# Patient Record
Sex: Female | Born: 1975 | Race: Black or African American | Hispanic: No | Marital: Single | Smoking: Never smoker
Health system: Southern US, Community
[De-identification: ages and names within clinical notes are randomized; demographics above are authoritative.]

## PROBLEM LIST (undated history)

## (undated) DIAGNOSIS — D649 Anemia, unspecified: Secondary | ICD-10-CM

## (undated) DIAGNOSIS — D219 Benign neoplasm of connective and other soft tissue, unspecified: Secondary | ICD-10-CM

## (undated) DIAGNOSIS — K2970 Gastritis, unspecified, without bleeding: Secondary | ICD-10-CM

## (undated) HISTORY — DX: Gastritis, unspecified, without bleeding: K29.70

---

## 1998-11-22 ENCOUNTER — Emergency Department (HOSPITAL_COMMUNITY): Admission: EM | Admit: 1998-11-22 | Discharge: 1998-11-22 | Payer: Self-pay | Admitting: Emergency Medicine

## 1998-12-27 ENCOUNTER — Emergency Department (HOSPITAL_COMMUNITY): Admission: EM | Admit: 1998-12-27 | Discharge: 1998-12-27 | Payer: Self-pay | Admitting: Emergency Medicine

## 1998-12-30 ENCOUNTER — Inpatient Hospital Stay (HOSPITAL_COMMUNITY): Admission: AD | Admit: 1998-12-30 | Discharge: 1998-12-30 | Payer: Self-pay | Admitting: Obstetrics

## 1999-01-16 ENCOUNTER — Other Ambulatory Visit: Admission: RE | Admit: 1999-01-16 | Discharge: 1999-01-16 | Payer: Self-pay | Admitting: Obstetrics

## 1999-01-16 ENCOUNTER — Inpatient Hospital Stay (HOSPITAL_COMMUNITY): Admission: AD | Admit: 1999-01-16 | Discharge: 1999-01-16 | Payer: Self-pay | Admitting: Obstetrics & Gynecology

## 1999-08-07 ENCOUNTER — Inpatient Hospital Stay (HOSPITAL_COMMUNITY): Admission: AD | Admit: 1999-08-07 | Discharge: 1999-08-07 | Payer: Self-pay | Admitting: Obstetrics

## 1999-08-16 ENCOUNTER — Inpatient Hospital Stay (HOSPITAL_COMMUNITY): Admission: AD | Admit: 1999-08-16 | Discharge: 1999-08-16 | Payer: Self-pay | Admitting: Obstetrics

## 1999-08-18 ENCOUNTER — Encounter (HOSPITAL_COMMUNITY): Admission: EM | Admit: 1999-08-18 | Discharge: 1999-08-23 | Payer: Self-pay

## 1999-08-22 ENCOUNTER — Inpatient Hospital Stay (HOSPITAL_COMMUNITY): Admission: AD | Admit: 1999-08-22 | Discharge: 1999-08-24 | Payer: Self-pay | Admitting: *Deleted

## 1999-08-27 ENCOUNTER — Inpatient Hospital Stay (HOSPITAL_COMMUNITY): Admission: AD | Admit: 1999-08-27 | Discharge: 1999-08-27 | Payer: Self-pay | Admitting: Obstetrics

## 2000-07-08 ENCOUNTER — Inpatient Hospital Stay (HOSPITAL_COMMUNITY): Admission: AD | Admit: 2000-07-08 | Discharge: 2000-07-08 | Payer: Self-pay | Admitting: *Deleted

## 2000-07-08 ENCOUNTER — Encounter: Payer: Self-pay | Admitting: *Deleted

## 2000-07-15 ENCOUNTER — Inpatient Hospital Stay (HOSPITAL_COMMUNITY): Admission: AD | Admit: 2000-07-15 | Discharge: 2000-07-15 | Payer: Self-pay | Admitting: Obstetrics & Gynecology

## 2000-08-13 ENCOUNTER — Emergency Department (HOSPITAL_COMMUNITY): Admission: EM | Admit: 2000-08-13 | Discharge: 2000-08-13 | Payer: Self-pay | Admitting: Emergency Medicine

## 2001-10-24 ENCOUNTER — Emergency Department (HOSPITAL_COMMUNITY): Admission: EM | Admit: 2001-10-24 | Discharge: 2001-10-24 | Payer: Self-pay | Admitting: Emergency Medicine

## 2002-04-28 ENCOUNTER — Inpatient Hospital Stay (HOSPITAL_COMMUNITY): Admission: AD | Admit: 2002-04-28 | Discharge: 2002-04-28 | Payer: Self-pay | Admitting: *Deleted

## 2013-01-23 ENCOUNTER — Encounter: Payer: Self-pay | Admitting: Obstetrics

## 2013-02-11 ENCOUNTER — Encounter: Payer: Self-pay | Admitting: Obstetrics

## 2013-02-11 ENCOUNTER — Ambulatory Visit (INDEPENDENT_AMBULATORY_CARE_PROVIDER_SITE_OTHER): Payer: Medicaid Other | Admitting: Obstetrics

## 2013-02-11 VITALS — BP 102/82 | HR 81 | Temp 99.1°F | Ht 68.0 in | Wt 161.0 lb

## 2013-02-11 DIAGNOSIS — D259 Leiomyoma of uterus, unspecified: Secondary | ICD-10-CM | POA: Insufficient documentation

## 2013-02-11 NOTE — Progress Notes (Signed)
Subjective:     Janet Park is a 37 y.o. female here for a routine exam.  Current complaints: patient is in the office for heavy and constant bleeding with her fibroids. Patient has an IUD ( Mirena- 2 years) and fibroids that are 16 weeks..  Personal health questionnaire reviewed: no.   Gynecologic History No LMP recorded. Contraception: abstinence Last Pap: over 1 year. Results were: normal Last mammogram: never.   Obstetric History OB History  No data available     The following portions of the patient's history were reviewed and updated as appropriate: allergies, current medications, past family history, past medical history, past social history, past surgical history and problem list.  Review of Systems Pertinent items are noted in HPI.    Objective:    General appearance: alert and no distress Abdomen: normal findings: soft, non-tender Pelvic: cervix normal in appearance, external genitalia normal, no adnexal masses or tenderness, no cervical motion tenderness, vagina normal without discharge and uterus ~ 16 weeks size, NT.    Assessment:    Uterine fibroids, symptomatic.  Options discussed.   Plan:    Education reviewed: Management of uterine fibroids.Marland Kitchen

## 2014-07-07 ENCOUNTER — Other Ambulatory Visit (HOSPITAL_COMMUNITY): Payer: Self-pay | Admitting: Obstetrics

## 2014-07-07 DIAGNOSIS — R1900 Intra-abdominal and pelvic swelling, mass and lump, unspecified site: Secondary | ICD-10-CM

## 2014-07-07 DIAGNOSIS — Z1231 Encounter for screening mammogram for malignant neoplasm of breast: Secondary | ICD-10-CM

## 2014-07-07 DIAGNOSIS — D219 Benign neoplasm of connective and other soft tissue, unspecified: Secondary | ICD-10-CM

## 2014-07-14 ENCOUNTER — Ambulatory Visit (HOSPITAL_COMMUNITY)
Admission: RE | Admit: 2014-07-14 | Discharge: 2014-07-14 | Disposition: A | Discharge status: Home / Self Care | Payer: Medicaid Other | Source: Ambulatory Visit | Attending: Obstetrics | Admitting: Obstetrics

## 2014-07-14 DIAGNOSIS — R1900 Intra-abdominal and pelvic swelling, mass and lump, unspecified site: Secondary | ICD-10-CM | POA: Diagnosis not present

## 2014-07-14 DIAGNOSIS — D259 Leiomyoma of uterus, unspecified: Secondary | ICD-10-CM | POA: Insufficient documentation

## 2014-07-14 DIAGNOSIS — D219 Benign neoplasm of connective and other soft tissue, unspecified: Secondary | ICD-10-CM

## 2014-07-14 DIAGNOSIS — Z1231 Encounter for screening mammogram for malignant neoplasm of breast: Secondary | ICD-10-CM | POA: Diagnosis not present

## 2014-07-14 DIAGNOSIS — N939 Abnormal uterine and vaginal bleeding, unspecified: Secondary | ICD-10-CM | POA: Diagnosis not present

## 2014-07-14 DIAGNOSIS — Z975 Presence of (intrauterine) contraceptive device: Secondary | ICD-10-CM | POA: Diagnosis not present

## 2014-07-20 ENCOUNTER — Other Ambulatory Visit: Payer: Self-pay | Admitting: Obstetrics

## 2014-08-18 ENCOUNTER — Other Ambulatory Visit: Payer: Self-pay | Admitting: Obstetrics

## 2014-08-23 NOTE — H&P (Signed)
Janet Park, Janet Park NO.:  [id number]  MEDICAL RECORD NO.:  [id number]  LOCATION:                                 FACILITY:  PHYSICIAN:  Frederico Hamman, M.D.DATE OF BIRTH:  [birth year]  DATE OF ADMISSION: DATE OF DISCHARGE:                             HISTORY & PHYSICAL   The patient is a 39 year old, gravida 1, para 55, child 82 years old and she has an IUD and she has large myomas and abnormal bleeding and scheduled for a hysterectomy in April.  The patient's Pap smear was abnormal.  Colposcopy showed severe dysplasia.  GYN-Oncology consult was obtained and this suggested a cervical conization, so patient is to have a conization on Wednesday 9th.  PAST MEDICAL HISTORY:  Negative.  PAST SURGICAL HISTORY:  Negative.  SOCIAL HISTORY:  Negative.  SYSTEM REVIEW:  Negative.  PHYSICAL EXAMINATION:  GENERAL:  Well-developed female in no distress. HEENT:  Negative. LUNGS:  Clear to P and A. HEART:  Regular rhythm.  No murmurs.  No gallops. BREASTS:  Negative. ABDOMEN:  Mass arising from the pelvis, 22-week size, myomas. EXTREMITIES:  Negative.          ______________________________ Frederico Hamman, M.D.     BAM/MEDQ  D:  08/21/2014  T:  08/21/2014  Job:  [id number]

## 2014-08-23 NOTE — Patient Instructions (Addendum)
   Your procedure is scheduled on:  Wednesday, March 9   Enter through the [employer] [facility] at: 7 AM Pick up the phone at the desk and dial [phone] and inform us of your arrival.  Please call this number if you have any problems the morning of surgery: [phone]  Remember: Do not eat or drink after midnight: Tuesday Take these medicines the morning of surgery with a SIP OF WATER:  None  Do not wear jewelry, make-up, or FINGER nail polish No metal in your hair or on your body. Do not wear lotions, powders, perfumes.  You may wear deodorant.  Do not bring valuables to the hospital. Contacts, dentures or bridgework may not be worn into surgery.  Patients discharged on the day of surgery will not be allowed to drive home.  Home with niece Janet Park cell [phone]

## 2014-08-24 ENCOUNTER — Encounter (INDEPENDENT_AMBULATORY_CARE_PROVIDER_SITE_OTHER): Payer: Self-pay

## 2014-08-24 ENCOUNTER — Encounter (HOSPITAL_COMMUNITY)
Admission: RE | Admit: 2014-08-24 | Discharge: 2014-08-24 | Disposition: A | Discharge status: Home / Self Care | Payer: Medicaid Other | Source: Ambulatory Visit | Attending: Obstetrics | Admitting: Obstetrics

## 2014-08-24 ENCOUNTER — Encounter (HOSPITAL_COMMUNITY): Payer: Self-pay

## 2014-08-24 DIAGNOSIS — Z975 Presence of (intrauterine) contraceptive device: Secondary | ICD-10-CM | POA: Diagnosis not present

## 2014-08-24 DIAGNOSIS — D259 Leiomyoma of uterus, unspecified: Secondary | ICD-10-CM | POA: Diagnosis not present

## 2014-08-24 DIAGNOSIS — D069 Carcinoma in situ of cervix, unspecified: Secondary | ICD-10-CM | POA: Diagnosis not present

## 2014-08-24 HISTORY — DX: Benign neoplasm of connective and other soft tissue, unspecified: D21.9

## 2014-08-24 HISTORY — DX: Anemia, unspecified: D64.9

## 2014-08-24 LAB — CBC
HCT: 36.1 % (ref 36.0–46.0)
HEMOGLOBIN: 11 g/dL — AB (ref 12.0–15.0)
MCH: 24.3 pg — AB (ref 26.0–34.0)
MCHC: 30.5 g/dL (ref 30.0–36.0)
MCV: 79.9 fL (ref 78.0–100.0)
Platelets: 356 10*3/uL (ref 150–400)
RBC: 4.52 MIL/uL (ref 3.87–5.11)
RDW: 12.9 % (ref 11.5–15.5)
WBC: 5.5 10*3/uL (ref 4.0–10.5)

## 2014-08-24 NOTE — Anesthesia Preprocedure Evaluation (Signed)
Anesthesia Evaluation  Patient identified by MRN, date of birth, ID band Patient awake    Reviewed: Allergy & Precautions, NPO status , Patient's Chart, lab work & pertinent test results  History of Anesthesia Complications Negative for: history of anesthetic complications  Airway Mallampati: II  TM Distance: >3 FB Neck ROM: Full    Dental no notable dental hx. (+) Dental Advisory Given   Pulmonary neg pulmonary ROS,  breath sounds clear to auscultation  Pulmonary exam normal       Cardiovascular negative cardio ROS  Rhythm:Regular Rate:Normal     Neuro/Psych negative neurological ROS  negative psych ROS   GI/Hepatic negative GI ROS, Neg liver ROS,   Endo/Other  negative endocrine ROS  Renal/GU negative Renal ROS  Female GU complaint     Musculoskeletal negative musculoskeletal ROS (+)   Abdominal   Peds negative pediatric ROS (+)  Hematology  (+) anemia ,   Anesthesia Other Findings   Reproductive/Obstetrics negative OB ROS                             Anesthesia Physical Anesthesia Plan  ASA: II  Anesthesia Plan: General   Post-op Pain Management:    Induction: Intravenous  Airway Management Planned: LMA  Additional Equipment:   Intra-op Plan:   Post-operative Plan: Extubation in OR  Informed Consent: I have reviewed the patients History and Physical, chart, labs and discussed the procedure including the risks, benefits and alternatives for the proposed anesthesia with the patient or authorized representative who has indicated his/her understanding and acceptance.   Dental advisory given  Plan Discussed with: CRNA  Anesthesia Plan Comments:         Anesthesia Quick Evaluation

## 2014-08-25 ENCOUNTER — Encounter (HOSPITAL_COMMUNITY): Payer: Self-pay | Admitting: Anesthesiology

## 2014-08-25 ENCOUNTER — Encounter (HOSPITAL_COMMUNITY)
Admission: RE | Disposition: A | Discharge status: Home / Self Care | Payer: Self-pay | Source: Ambulatory Visit | Attending: Obstetrics

## 2014-08-25 ENCOUNTER — Ambulatory Visit (HOSPITAL_COMMUNITY)
Admission: RE | Admit: 2014-08-25 | Discharge: 2014-08-25 | Disposition: A | Discharge status: Home / Self Care | Payer: Medicaid Other | Source: Ambulatory Visit | Attending: Obstetrics | Admitting: Obstetrics

## 2014-08-25 ENCOUNTER — Ambulatory Visit (HOSPITAL_COMMUNITY): Payer: Medicaid Other | Admitting: Anesthesiology

## 2014-08-25 DIAGNOSIS — D259 Leiomyoma of uterus, unspecified: Secondary | ICD-10-CM | POA: Diagnosis not present

## 2014-08-25 DIAGNOSIS — D069 Carcinoma in situ of cervix, unspecified: Secondary | ICD-10-CM | POA: Diagnosis not present

## 2014-08-25 DIAGNOSIS — Z975 Presence of (intrauterine) contraceptive device: Secondary | ICD-10-CM | POA: Diagnosis not present

## 2014-08-25 HISTORY — PX: CERVICAL CONIZATION W/BX: SHX1330

## 2014-08-25 LAB — PREGNANCY, URINE: Preg Test, Ur: NEGATIVE

## 2014-08-25 SURGERY — CONE BIOPSY, CERVIX
Anesthesia: General | Site: Vagina

## 2014-08-25 MED ORDER — FENTANYL CITRATE 0.05 MG/ML IJ SOLN
INTRAMUSCULAR | Status: AC
  Filled 2014-08-25: qty 5

## 2014-08-25 MED ORDER — IODINE STRONG (LUGOLS) 5 % PO SOLN
ORAL | Status: AC
  Filled 2014-08-25: qty 1

## 2014-08-25 MED ORDER — LACTATED RINGERS IV SOLN
INTRAVENOUS | Status: DC
  Administered 2014-08-25 (×2): via INTRAVENOUS

## 2014-08-25 MED ORDER — DEXAMETHASONE SODIUM PHOSPHATE 10 MG/ML IJ SOLN
INTRAMUSCULAR | Status: AC
  Filled 2014-08-25: qty 1

## 2014-08-25 MED ORDER — PROPOFOL 10 MG/ML IV BOLUS
INTRAVENOUS | Status: AC
  Filled 2014-08-25: qty 20

## 2014-08-25 MED ORDER — GLYCOPYRROLATE 0.2 MG/ML IJ SOLN
INTRAMUSCULAR | Status: DC | PRN
  Administered 2014-08-25: 0.1 mg via INTRAVENOUS

## 2014-08-25 MED ORDER — ONDANSETRON HCL 4 MG/2ML IJ SOLN
INTRAMUSCULAR | Status: DC | PRN
  Administered 2014-08-25: 4 mg via INTRAVENOUS

## 2014-08-25 MED ORDER — ONDANSETRON HCL 4 MG/2ML IJ SOLN: 4.0000 mg | Freq: Once | INTRAMUSCULAR | Status: DC | PRN

## 2014-08-25 MED ORDER — MIDAZOLAM HCL 2 MG/2ML IJ SOLN
INTRAMUSCULAR | Status: AC
  Filled 2014-08-25: qty 2

## 2014-08-25 MED ORDER — LIDOCAINE HCL 1 % IJ SOLN
INTRAMUSCULAR | Status: AC
  Filled 2014-08-25: qty 20

## 2014-08-25 MED ORDER — GLYCOPYRROLATE 0.2 MG/ML IJ SOLN
INTRAMUSCULAR | Status: AC
  Filled 2014-08-25: qty 1

## 2014-08-25 MED ORDER — LIDOCAINE HCL (CARDIAC) 20 MG/ML IV SOLN
INTRAVENOUS | Status: AC
  Filled 2014-08-25: qty 5

## 2014-08-25 MED ORDER — SCOPOLAMINE 1 MG/3DAYS TD PT72
1.0000 | MEDICATED_PATCH | Freq: Once | TRANSDERMAL | Status: DC
  Administered 2014-08-25: 1.5 mg via TRANSDERMAL

## 2014-08-25 MED ORDER — FENTANYL CITRATE 0.05 MG/ML IJ SOLN
INTRAMUSCULAR | Status: DC | PRN
  Administered 2014-08-25 (×2): 50 ug via INTRAVENOUS

## 2014-08-25 MED ORDER — FERRIC SUBSULFATE 259 MG/GM EX SOLN
CUTANEOUS | Status: AC
  Filled 2014-08-25: qty 8

## 2014-08-25 MED ORDER — ACETIC ACID 5 % SOLN
Status: AC
Start: 2014-08-25 — End: 2014-08-25
  Filled 2014-08-25: qty 500

## 2014-08-25 MED ORDER — PROPOFOL 10 MG/ML IV BOLUS
INTRAVENOUS | Status: DC | PRN
  Administered 2014-08-25: 200 mg via INTRAVENOUS

## 2014-08-25 MED ORDER — DEXAMETHASONE SODIUM PHOSPHATE 10 MG/ML IJ SOLN
INTRAMUSCULAR | Status: DC | PRN
  Administered 2014-08-25: 4 mg via INTRAVENOUS

## 2014-08-25 MED ORDER — FENTANYL CITRATE 0.05 MG/ML IJ SOLN: 25.0000 ug | INTRAMUSCULAR | Status: DC | PRN

## 2014-08-25 MED ORDER — ONDANSETRON HCL 4 MG/2ML IJ SOLN
INTRAMUSCULAR | Status: AC
  Filled 2014-08-25: qty 2

## 2014-08-25 MED ORDER — LIDOCAINE HCL (CARDIAC) 20 MG/ML IV SOLN
INTRAVENOUS | Status: DC | PRN
  Administered 2014-08-25: 80 mg via INTRAVENOUS

## 2014-08-25 MED ORDER — SCOPOLAMINE 1 MG/3DAYS TD PT72
MEDICATED_PATCH | TRANSDERMAL | Status: AC
  Filled 2014-08-25: qty 1

## 2014-08-25 MED ORDER — KETOROLAC TROMETHAMINE 30 MG/ML IJ SOLN
INTRAMUSCULAR | Status: AC
  Filled 2014-08-25: qty 1

## 2014-08-25 MED ORDER — MIDAZOLAM HCL 2 MG/2ML IJ SOLN
INTRAMUSCULAR | Status: DC | PRN
  Administered 2014-08-25: 2 mg via INTRAVENOUS

## 2014-08-25 SURGICAL SUPPLY — 21 items
APPLICATOR COTTON TIP 6IN STRL (MISCELLANEOUS) IMPLANT
BLADE SURG 11 STRL SS (BLADE) ×3 IMPLANT
CLOTH BEACON ORANGE TIMEOUT ST (SAFETY) ×3 IMPLANT
CONTAINER PREFILL 10% NBF 60ML (FORM) ×3 IMPLANT
COUNTER NEEDLE 1200 MAGNETIC (NEEDLE) IMPLANT
ELECT REM PT RETURN 9FT ADLT (ELECTROSURGICAL)
ELECTRODE REM PT RTRN 9FT ADLT (ELECTROSURGICAL) IMPLANT
GAUZE PACKING 2X5 YD STRL (GAUZE/BANDAGES/DRESSINGS) ×3 IMPLANT
GLOVE BIO SURGEON STRL SZ8.5 (GLOVE) ×3 IMPLANT
GOWN STRL REUS W/TWL 2XL LVL3 (GOWN DISPOSABLE) ×3 IMPLANT
GOWN STRL REUS W/TWL LRG LVL3 (GOWN DISPOSABLE) ×3 IMPLANT
NS IRRIG 1000ML POUR BTL (IV SOLUTION) ×3 IMPLANT
PACK VAGINAL MINOR WOMEN LF (CUSTOM PROCEDURE TRAY) ×3 IMPLANT
PAD OB MATERNITY 4.3X12.25 (PERSONAL CARE ITEMS) ×3 IMPLANT
PENCIL BUTTON HOLSTER BLD 10FT (ELECTRODE) IMPLANT
SCOPETTES 8  STERILE (MISCELLANEOUS) ×4
SCOPETTES 8 STERILE (MISCELLANEOUS) ×2 IMPLANT
SPONGE SURGIFOAM ABS GEL 12-7 (HEMOSTASIS) IMPLANT
SUT CHROMIC 1 CT1 27 (SUTURE) IMPLANT
TOWEL OR 17X24 6PK STRL BLUE (TOWEL DISPOSABLE) ×6 IMPLANT
WATER STERILE IRR 1000ML POUR (IV SOLUTION) ×3 IMPLANT

## 2014-08-25 NOTE — Op Note (Signed)
Preop diagnosis severe dysplasia of the cervix and 24 week size myomatous Postop diagnosis the sam failed attempt  at cervical conization Anesthesia Gen. Surgeon Dr. Gracy Racer Procedure  Under  general anesthesia per vagina prepped and draped bladder emptied with a straight catheter bimanual exam revealed multiple myomas 24 weeks size and posterior to the cervix is a large myoma the cervix was deviated to the left and was not visualized in the vagina a weighted speculum place and retractors placed cervix still not visualized tenaculum was used to grasp the mucosa of the vagina close to the cervix but the cervix could not be visualized and there was bleeding from the site with a tenaculum was used to grasp the vagina and it was decided the vagina would be packed and the packing removed in 24 hours no other procedure was done

## 2014-08-25 NOTE — Transfer of Care (Signed)
Immediate Anesthesia Transfer of Care Note  Patient: Janet Park  Procedure(s) Performed: Procedure(s): ATTEMPTED CONIZATION CERVIX  (N/A)  Patient Location: PACU  Anesthesia Type:General  Level of Consciousness: awake, alert , oriented and patient cooperative  Airway & Oxygen Therapy: Patient Spontanous Breathing and Patient connected to nasal cannula oxygen  Post-op Assessment: Report given to RN and Post -op Vital signs reviewed and stable  Post vital signs: Reviewed and stable  Last Vitals:  Filed Vitals:   08/25/14 0710  BP: 109/76  Pulse: 83  Temp: 37.3 C  Resp: 18    Complications: No apparent anesthesia complications

## 2014-08-25 NOTE — H&P (Signed)
  There has been no change in her h and p since the original l dictation

## 2014-08-25 NOTE — Anesthesia Postprocedure Evaluation (Signed)
  Anesthesia Post-op Note  Patient: Janet Park  Procedure(s) Performed: Procedure(s) (LRB): ATTEMPTED CONIZATION CERVIX  (N/A)  Patient Location: PACU  Anesthesia Type: General  Level of Consciousness: awake and alert   Airway and Oxygen Therapy: Patient Spontanous Breathing  Post-op Pain: mild  Post-op Assessment: Post-op Vital signs reviewed, Patient's Cardiovascular Status Stable, Respiratory Function Stable, Patent Airway and No signs of Nausea or vomiting  Last Vitals:  Filed Vitals:   08/25/14 0910  BP: 117/72  Pulse: 76  Temp: 37.1 C  Resp: 12    Post-op Vital Signs: stable   Complications: No apparent anesthesia complications

## 2014-08-25 NOTE — Discharge Instructions (Signed)
°  Post Anesthesia Home Care Instructions  Activity: Get plenty of rest for the remainder of the day. A responsible adult should stay with you for 24 hours following the procedure.  For the next 24 hours, DO NOT: -Drive a car -Paediatric nurse -Drink alcoholic beverages -Take any medication unless instructed by your physician -Make any legal decisions or sign important papers.  Meals: Start with liquid foods such as gelatin or soup. Progress to regular foods as tolerated. Avoid greasy, spicy, heavy foods. If nausea and/or vomiting occur, drink only clear liquids until the nausea and/or vomiting subsides. Call your physician if vomiting continues.  Special Instructions/Symptoms: Your throat may feel dry or sore from the anesthesia or the breathing tube placed in your throat during surgery. If this causes discomfort, gargle with warm salt water. The discomfort should disappear within 24 hours. DISCHARGE INSTRUCTIONS: D&C / D&E The following instructions have been prepared to help you care for yourself upon your return home.   Personal hygiene:  Use sanitary pads for vaginal drainage, not tampons.  Shower the day after your procedure.  NO tub baths, pools or Jacuzzis for 2-3 weeks.  Wipe front to back after using the bathroom.  Activity and limitations:  Do NOT drive or operate any equipment for 24 hours. The effects of anesthesia are still present and drowsiness may result.  Do NOT rest in bed all day.  Walking is encouraged.  Walk up and down stairs slowly.  You may resume your normal activity in one to two days or as indicated by your physician.  Sexual activity: NO intercourse for at least 2 weeks after the procedure, or as indicated by your physician.  Diet: Eat a light meal as desired this evening. You may resume your usual diet tomorrow.  Return to work: You may resume your work activities in one to two days or as indicated by your doctor.  What to expect after your  surgery: Expect to have vaginal bleeding/discharge for 2-3 days and spotting for up to 10 days. It is not unusual to have soreness for up to 1-2 weeks. You may have a slight burning sensation when you urinate for the first day. Mild cramps may continue for a couple of days. You may have a regular period in 2-6 weeks.  Call your doctor for any of the following:  Excessive vaginal bleeding, saturating and changing one pad every hour.  Inability to urinate 6 hours after discharge from hospital.  Pain not relieved by pain medication.  Fever of 100.4 F or greater.  Unusual vaginal discharge or odor.   Go to Dr. Marcheta Grammes office tomorrow, March 10, for removal of packing.   Patients signature: ______________________  Nurses signature ________________________  Support person's signature_______________________

## 2014-08-26 ENCOUNTER — Encounter (HOSPITAL_COMMUNITY): Payer: Self-pay | Admitting: Obstetrics

## 2014-09-01 ENCOUNTER — Encounter: Payer: Self-pay | Admitting: Gynecologic Oncology

## 2014-09-01 ENCOUNTER — Ambulatory Visit: Payer: Medicaid Other | Attending: Gynecologic Oncology | Admitting: Gynecologic Oncology

## 2014-09-01 VITALS — BP 128/86 | HR 88 | Temp 98.1°F | Resp 20 | Ht 68.0 in | Wt 179.7 lb

## 2014-09-01 DIAGNOSIS — D069 Carcinoma in situ of cervix, unspecified: Secondary | ICD-10-CM | POA: Insufficient documentation

## 2014-09-01 DIAGNOSIS — Z975 Presence of (intrauterine) contraceptive device: Secondary | ICD-10-CM | POA: Insufficient documentation

## 2014-09-01 DIAGNOSIS — D259 Leiomyoma of uterus, unspecified: Secondary | ICD-10-CM | POA: Insufficient documentation

## 2014-09-01 NOTE — Patient Instructions (Signed)
Plan to proceed with surgery by Dr. Ruthann Cancer. Dr. Denman George will send him information from today's visit. Please call us in the future with any questions or concerns.

## 2014-09-01 NOTE — Progress Notes (Signed)
Consult Note: Gyn-Onc  Consult was requested by Dr. 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  CC:  Chief Complaint  Patient presents with  . CIN III    Assessment/Plan:  Janet Park  is a 39 y.o.  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 She was unable to have conization performed due to fibroids preventing good visualization of the cervix intraoperatively.  On my examination today I do not see any gross evidence of macroscopic cervical cancer. The cervix is normal to palpate. It is of soft consistency and the parametria are free of palpable disease. All of this is reassuring that her disease is most likely either preinvasive or early microscopic invasive disease. I discussed with Janet Park the limitations of visual inspection and palpation in diagnosing malignancy. We discussed that the gold standard preoperative evaluation would be an excisional procedure of the cervix. However given that this was not possible I believe that it is reasonable to proceed with a simple extrafascial hysterectomy.  I discussed with Janet Park that the concern with proceeding with extrafascial hysterectomy without prior excisional biopsy is that if substantial invasive cervical cancer is overall looked, she will have a hysterectomy with inadequate margins (not a radical hysterectomy) which may result in her requiring postoperative adjuvant radiation, or, in the case of a cut through hysterectomy, less effective administration of radiation and worse oncologic outcomes.  However radical hysterectomy is associated with substantially higher risk for perioperative morbidity, particularly risk for ureteral injury, stricture, fistula, or GI or urinary tract injury. I also discussed the increased risk for neuropathic bladder and bowel after radical hysterectomy. It is for these reasons that radical hysterectomy should only be performed in the setting of known  invasive cancer involving the cervix.  An alternative preoperative workup with an in office LEEP should be considered as the patient's cervix is more apparent on physical exam in the office without anesthesia. Our offices here at [facility] are not equipped with LEEP equipment and therefore we are unable to offer this service, however if Dr Ruthann Cancer has this equipment available to him, a LEEP would provide important diagnostic information prior to a hysterectomy, and is more accurate at ruling out invasive cancer than my visual inspection and palpation.   HPI: Janet Park is a 39 year old G1 P1 who is seen in consultation at the request of Dr Ruthann Cancer for CINIII of the cervix. She has symptomatically uterine fibroids and CIN3 on cervical biopsy at the time of colposcopy on 08/02/2014 for high-grade squamous intraepithelial lesion on Pap smear. The Pap smear was performed on the 06/28/2014. The patient does not have a prior history of abnormal Pap smears but she does report she has not had regular cytologic evaluations in the recent decade. As part of her workup for her symptomatically uterine fibroid she underwent ultrasound scan on 07/14/2014. This revealed a uterus measuring 18.4 x 10.3 x 19.2 cm. It contained multiple uterine fibroids seen throughout it. She hasn't IUD within the uterine cavity. The right ovary was not visualized however the left was normal in appearance.  Prior to proceeding with hysterectomy Dr. Ruthann Cancer took the patient to the operating room on 08/21/2014 to perform a cold knife conization of the cervix. At the time of anesthesia the patient's vaginal walls became redundant and the uterine fibroid occluded view of the cervix per Dr. Marcheta Grammes report. This prevented the ability to perform safe conization of the cervix.  Dr Ruthann Cancer desires my opinion regarding the liklihood of underlying invasive cancer diagnosis prior to proceeding with hysterectomy.  Interval History: The  patient reports intermenstrual bleeding x 2 years. She has extremely heavy menses, refractory to the IUD. She has mass effect symptoms with back pain. She denies LE edema. She does have some watery discharge.  Current Meds:  Outpatient Encounter Prescriptions as of 09/01/2014  Medication Sig  . ferrous sulfate 325 (65 FE) MG tablet Take 325 mg by mouth daily with breakfast.    Allergy: No Known Allergies  Social Hx:   History   Social History  . Marital Status: Single    Spouse Name: N/A  . Number of Children: N/A  . Years of Education: N/A   Occupational History  . Not on file.   Social History Main Topics  . Smoking status: Never Smoker   . Smokeless tobacco: Never Used  . Alcohol Use: No  . Drug Use: No  . Sexual Activity: No     Comment: Mirena IUD   Other Topics Concern  . Not on file   Social History Narrative    Past Surgical Hx:  Past Surgical History  Procedure Laterality Date  . Cervical conization w/bx N/A 08/25/2014    Procedure: ATTEMPTED CONIZATION CERVIX ;  Surgeon: Frederico Hamman, MD;  Location: [facility];  Service: Gynecology;  Laterality: N/A;    Past Medical Hx:  Past Medical History  Diagnosis Date  . Anemia   . Fibroid   . SVD (spontaneous vaginal delivery)     x 1    Past Gynecological History:  SVD x 1. CIN III.  No LMP recorded.  Family Hx:  Family History  Problem Relation Age of Onset  . Kidney disease Father     Review of Systems:  Constitutional  Feels well,    ENT Normal appearing ears and nares bilaterally Skin/Breast  No rash, sores, jaundice, itching, dryness Cardiovascular  No chest pain, shortness of breath, or edema  Pulmonary  No cough or wheeze.  Gastro Intestinal  No nausea, vomitting, or diarrhoea. No bright red blood per rectum, no abdominal pain, change in bowel movement, or constipation.  Genito Urinary  No frequency, urgency, dysuria, see HPI Musculo Skeletal  No myalgia, arthralgia, joint swelling  or pain  Neurologic  No weakness, numbness, change in gait,  Psychology  No depression, anxiety, insomnia.   Vitals:  Blood pressure 128/86, pulse 88, temperature 98.1 F (36.7 C), temperature source Oral, resp. rate 20, height 5\' 8"  (1.727 m), weight 179 lb 11.2 oz (81.511 kg).  Physical Exam: WD in NAD Neck  Supple NROM, without any enlargements.  Lymph Node Survey No cervical supraclavicular or inguinal adenopathy Cardiovascular  Pulse normal rate, regularity and rhythm. S1 and S2 normal.  Lungs  Clear to auscultation bilateraly, without wheezes/crackles/rhonchi. Good air movement.  Skin  No rash/lesions/breakdown  Psychiatry  Alert and oriented to person, place, and time  Abdomen  Normoactive bowel sounds, abdomen soft, non-tender and thin without evidence of hernia. Back No CVA tenderness Genito Urinary  Vulva/vagina: Normal external female genitalia.  No lesions. No discharge or bleeding.  Bladder/urethra:  No lesions or masses, well supported bladder  Vagina: normal in appearance and to palpate.  Cervix: Able to be visualized with the graves speculum. Prominent ectropion, but no gross exophytic tumor on ectocervix. Bimanual exam confirms a soft, smooth regular cervix, somewhat displaced by large lower uterine segment fibroids.  Uterus: bulky, 18 week size uterus,  minimally mobile.  Adnexa: no palpable masses. Rectal  Good tone, no masses no cul de sac nodularity. No parametrial induration. Extremities  No bilateral cyanosis, clubbing or edema.   Donaciano Eva, MD   09/01/2014, 2:26 PM

## 2014-09-13 NOTE — H&P (Signed)
NAME:  Janet Park, Janet Park                     ACCOUNT NO.:  MEDICAL RECORD NO.:  [id number]  LOCATION:                                 FACILITY:  PHYSICIAN:  Frederico Hamman, M.D.DATE OF BIRTH:  July 21, 1975  DATE OF ADMISSION: DATE OF DISCHARGE:                             HISTORY & PHYSICAL   HISTORY OF PRESENT ILLNESS:  The patient is a 39 year old gravida 1, para 1-0-0-1.  She has a child of 25 years of age and she has an IUD, unable to find the string and she was seen because of myoma uteri and bleeding every 2 weeks.  After she was seen, her Pap smear was done and the Pap showed severe dysplasia.  Colposcopy showed moderate-to-severe dysplasia and the patient was discussed with Dr. Denman George from [facility] and she suggested a cervical conization.  The patient was taken to the operating room on August 25, 2014, for a conization; however, after she was put to sleep, the cervix could not be visualized because of the large posterior cervical myoma, which prolapsed into the vagina, so the procedure was an exam under anesthesia.  She was referred to Dr. Denman George, who colposcoped her instead.  She did not think the patient had a cancer of the cervix and could have regular abdominal hysterectomy.  SOCIAL HISTORY:  She is a nonsmoker.  ALLERGIES:  She has no allergies.  PAST SURGICAL HISTORY:  Exam under anesthesia August 25, 2014.  REVIEW OF SYSTEMS:  Negative.  PHYSICAL EXAMINATION:  GENERAL:  Well-developed female in no distress. HEENT:  Negative. LUNGS:  Clear to P and A. HEART:  Regular rhythm.  No murmurs, no gallops. BREASTS:  Negative. ABDOMEN:  Mass arising from the pelvis, 24-week size, myomatous, confirmed by ultrasound.  External genitalia normal. EXTREMITIES:  Negative.          ______________________________ Frederico Hamman, M.D.     BAM/MEDQ  D:  09/13/2014  T:  09/13/2014  Job:  [id number]

## 2014-09-20 NOTE — Patient Instructions (Addendum)
   Your procedure is scheduled on:  Wednesday, April 6  Enter through the [employer] [facility] at: 6 AM Pick up the phone at the desk and dial [phone] and inform us of your arrival.  Please call this number if you have any problems the morning of surgery: [phone]  Remember: Do not eat or drink after midnight: Tuesday Take these medicines the morning of surgery with a SIP OF WATER:  None  Do not wear jewelry, make-up, or FINGER nail polish No metal in your hair or on your body. Do not wear lotions, powders, perfumes.  You may wear deodorant.  Do not bring valuables to the hospital. Contacts, dentures or bridgework may not be worn into surgery.  Leave suitcase in the car. After Surgery it may be brought to your room. For patients being admitted to the hospital, checkout time is 11:00am the day of discharge.  Home with niece Ananda cell [phone]

## 2014-09-21 ENCOUNTER — Encounter (HOSPITAL_COMMUNITY)
Admission: RE | Admit: 2014-09-21 | Discharge: 2014-09-21 | Disposition: A | Discharge status: Home / Self Care | Payer: Medicaid Other | Source: Ambulatory Visit | Attending: Obstetrics | Admitting: Obstetrics

## 2014-09-21 LAB — CBC
HCT: 37 % (ref 36.0–46.0)
Hemoglobin: 11.2 g/dL — ABNORMAL LOW (ref 12.0–15.0)
MCH: 24.2 pg — ABNORMAL LOW (ref 26.0–34.0)
MCHC: 30.3 g/dL (ref 30.0–36.0)
MCV: 80.1 fL (ref 78.0–100.0)
Platelets: 382 10*3/uL (ref 150–400)
RBC: 4.62 MIL/uL (ref 3.87–5.11)
RDW: 13.4 % (ref 11.5–15.5)
WBC: 5.8 10*3/uL (ref 4.0–10.5)

## 2014-09-21 LAB — ABO/RH: ABO/RH(D): B POS

## 2014-09-21 LAB — TYPE AND SCREEN
ABO/RH(D): B POS
Antibody Screen: NEGATIVE

## 2014-09-22 ENCOUNTER — Encounter (HOSPITAL_COMMUNITY): Payer: Self-pay | Admitting: *Deleted

## 2014-09-22 ENCOUNTER — Inpatient Hospital Stay (HOSPITAL_COMMUNITY): Payer: Medicaid Other | Admitting: Anesthesiology

## 2014-09-22 ENCOUNTER — Inpatient Hospital Stay (HOSPITAL_COMMUNITY)
Admission: RE | Admit: 2014-09-22 | Discharge: 2014-09-26 | DRG: 743 | Disposition: A | Discharge status: Home / Self Care | Payer: Medicaid Other | Source: Ambulatory Visit | Attending: Obstetrics | Admitting: Obstetrics

## 2014-09-22 ENCOUNTER — Encounter (HOSPITAL_COMMUNITY)
Admission: RE | Disposition: A | Discharge status: Home / Self Care | Payer: Self-pay | Source: Ambulatory Visit | Attending: Obstetrics

## 2014-09-22 DIAGNOSIS — D251 Intramural leiomyoma of uterus: Principal | ICD-10-CM | POA: Diagnosis present

## 2014-09-22 DIAGNOSIS — D069 Carcinoma in situ of cervix, unspecified: Secondary | ICD-10-CM | POA: Diagnosis present

## 2014-09-22 DIAGNOSIS — N879 Dysplasia of cervix uteri, unspecified: Secondary | ICD-10-CM | POA: Diagnosis not present

## 2014-09-22 DIAGNOSIS — D259 Leiomyoma of uterus, unspecified: Secondary | ICD-10-CM | POA: Diagnosis present

## 2014-09-22 HISTORY — PX: BILATERAL SALPINGECTOMY: SHX5743

## 2014-09-22 HISTORY — PX: ABDOMINAL HYSTERECTOMY: SHX81

## 2014-09-22 LAB — PREGNANCY, URINE: Preg Test, Ur: NEGATIVE

## 2014-09-22 SURGERY — HYSTERECTOMY, ABDOMINAL
Anesthesia: General | Site: Abdomen

## 2014-09-22 MED ORDER — FENTANYL CITRATE 0.05 MG/ML IJ SOLN
INTRAMUSCULAR | Status: AC
  Filled 2014-09-22: qty 5

## 2014-09-22 MED ORDER — ONDANSETRON HCL 4 MG/2ML IJ SOLN
INTRAMUSCULAR | Status: AC
  Filled 2014-09-22: qty 2

## 2014-09-22 MED ORDER — HYDROMORPHONE HCL 1 MG/ML IJ SOLN
0.2000 mg | INTRAMUSCULAR | Status: DC | PRN
  Administered 2014-09-22 (×2): 0.6 mg via INTRAVENOUS
  Filled 2014-09-22 (×2): qty 1

## 2014-09-22 MED ORDER — EPHEDRINE SULFATE 50 MG/ML IJ SOLN
INTRAMUSCULAR | Status: DC | PRN
  Administered 2014-09-22: 10 mg via INTRAVENOUS

## 2014-09-22 MED ORDER — HYDROMORPHONE HCL 1 MG/ML IJ SOLN
INTRAMUSCULAR | Status: AC
  Filled 2014-09-22: qty 1

## 2014-09-22 MED ORDER — LIDOCAINE HCL (CARDIAC) 20 MG/ML IV SOLN
INTRAVENOUS | Status: AC
  Filled 2014-09-22: qty 5

## 2014-09-22 MED ORDER — BUPIVACAINE LIPOSOME 1.3 % IJ SUSP
20.0000 mL | Freq: Once | INTRAMUSCULAR | Status: AC
  Administered 2014-09-22: 20 mL
  Filled 2014-09-22: qty 20

## 2014-09-22 MED ORDER — KETOROLAC TROMETHAMINE 30 MG/ML IJ SOLN: 30.0000 mg | Freq: Once | INTRAMUSCULAR | Status: DC

## 2014-09-22 MED ORDER — ONDANSETRON HCL 4 MG/2ML IJ SOLN
INTRAMUSCULAR | Status: DC | PRN
  Administered 2014-09-22: 4 mg via INTRAVENOUS

## 2014-09-22 MED ORDER — PROMETHAZINE HCL 25 MG/ML IJ SOLN: 6.2500 mg | INTRAMUSCULAR | Status: DC | PRN

## 2014-09-22 MED ORDER — GLYCOPYRROLATE 0.2 MG/ML IJ SOLN
INTRAMUSCULAR | Status: DC | PRN
  Administered 2014-09-22: .4 mg via INTRAVENOUS

## 2014-09-22 MED ORDER — LIDOCAINE HCL (CARDIAC) 20 MG/ML IV SOLN
INTRAVENOUS | Status: DC | PRN
  Administered 2014-09-22: 50 mg via INTRAVENOUS

## 2014-09-22 MED ORDER — MEPERIDINE HCL 25 MG/ML IJ SOLN: 6.2500 mg | INTRAMUSCULAR | Status: DC | PRN

## 2014-09-22 MED ORDER — CEFAZOLIN SODIUM-DEXTROSE 2-3 GM-% IV SOLR
2.0000 g | Freq: Once | INTRAVENOUS | Status: AC
  Administered 2014-09-22: 2 g via INTRAVENOUS

## 2014-09-22 MED ORDER — IBUPROFEN 800 MG PO TABS: 800.0000 mg | ORAL_TABLET | Freq: Three times a day (TID) | ORAL | Status: DC | PRN

## 2014-09-22 MED ORDER — FENTANYL CITRATE 0.05 MG/ML IJ SOLN
INTRAMUSCULAR | Status: DC | PRN
  Administered 2014-09-22: 150 ug via INTRAVENOUS
  Administered 2014-09-22 (×2): 100 ug via INTRAVENOUS
  Administered 2014-09-22: 50 ug via INTRAVENOUS

## 2014-09-22 MED ORDER — OXYCODONE-ACETAMINOPHEN 5-325 MG PO TABS
1.0000 | ORAL_TABLET | ORAL | Status: DC | PRN
  Administered 2014-09-23 – 2014-09-24 (×8): 2 via ORAL
  Administered 2014-09-25: 1 via ORAL
  Administered 2014-09-25: 2 via ORAL
  Administered 2014-09-26 (×2): 1 via ORAL
  Filled 2014-09-22 (×6): qty 2
  Filled 2014-09-22: qty 1
  Filled 2014-09-22 (×2): qty 2
  Filled 2014-09-22 (×2): qty 1
  Filled 2014-09-22: qty 2

## 2014-09-22 MED ORDER — SCOPOLAMINE 1 MG/3DAYS TD PT72
1.0000 | MEDICATED_PATCH | Freq: Once | TRANSDERMAL | Status: DC
  Administered 2014-09-22: 1.5 mg via TRANSDERMAL

## 2014-09-22 MED ORDER — LACTATED RINGERS IV SOLN
INTRAVENOUS | Status: DC
  Administered 2014-09-22 (×4): via INTRAVENOUS

## 2014-09-22 MED ORDER — PROPOFOL 10 MG/ML IV BOLUS
INTRAVENOUS | Status: AC
  Filled 2014-09-22: qty 20

## 2014-09-22 MED ORDER — KETOROLAC TROMETHAMINE 30 MG/ML IJ SOLN
30.0000 mg | Freq: Four times a day (QID) | INTRAMUSCULAR | Status: DC
  Administered 2014-09-22 – 2014-09-25 (×7): 30 mg via INTRAVENOUS
  Filled 2014-09-22 (×8): qty 1

## 2014-09-22 MED ORDER — MIDAZOLAM HCL 2 MG/2ML IJ SOLN
INTRAMUSCULAR | Status: DC | PRN
  Administered 2014-09-22: 2 mg via INTRAVENOUS

## 2014-09-22 MED ORDER — HYDROMORPHONE HCL 1 MG/ML IJ SOLN
0.2500 mg | INTRAMUSCULAR | Status: DC | PRN
  Administered 2014-09-22 (×4): 0.5 mg via INTRAVENOUS

## 2014-09-22 MED ORDER — DEXAMETHASONE SODIUM PHOSPHATE 10 MG/ML IJ SOLN
INTRAMUSCULAR | Status: DC | PRN
  Administered 2014-09-22: 4 mg via INTRAVENOUS

## 2014-09-22 MED ORDER — HYDROMORPHONE HCL 1 MG/ML IJ SOLN
INTRAMUSCULAR | Status: AC
  Administered 2014-09-22: 0.5 mg via INTRAVENOUS
  Filled 2014-09-22: qty 1

## 2014-09-22 MED ORDER — KETOROLAC TROMETHAMINE 30 MG/ML IJ SOLN
INTRAMUSCULAR | Status: DC | PRN
  Administered 2014-09-22: 30 mg via INTRAVENOUS

## 2014-09-22 MED ORDER — 0.9 % SODIUM CHLORIDE (POUR BTL) OPTIME
TOPICAL | Status: DC | PRN
  Administered 2014-09-22 (×2): 1000 mL

## 2014-09-22 MED ORDER — BUPIVACAINE HCL (PF) 0.25 % IJ SOLN
INTRAMUSCULAR | Status: AC
Start: 2014-09-22 — End: 2014-09-22
  Filled 2014-09-22: qty 30

## 2014-09-22 MED ORDER — SCOPOLAMINE 1 MG/3DAYS TD PT72
MEDICATED_PATCH | TRANSDERMAL | Status: AC
  Filled 2014-09-22: qty 1

## 2014-09-22 MED ORDER — ROCURONIUM BROMIDE 100 MG/10ML IV SOLN
INTRAVENOUS | Status: AC
  Filled 2014-09-22: qty 1

## 2014-09-22 MED ORDER — DEXAMETHASONE SODIUM PHOSPHATE 4 MG/ML IJ SOLN
INTRAMUSCULAR | Status: AC
  Filled 2014-09-22: qty 1

## 2014-09-22 MED ORDER — NEOSTIGMINE METHYLSULFATE 10 MG/10ML IV SOLN
INTRAVENOUS | Status: DC | PRN
  Administered 2014-09-22: 2 mg via INTRAVENOUS

## 2014-09-22 MED ORDER — CEFAZOLIN SODIUM-DEXTROSE 2-3 GM-% IV SOLR
INTRAVENOUS | Status: AC
  Filled 2014-09-22: qty 50

## 2014-09-22 MED ORDER — EPHEDRINE 5 MG/ML INJ
INTRAVENOUS | Status: AC
  Filled 2014-09-22: qty 10

## 2014-09-22 MED ORDER — PROPOFOL 10 MG/ML IV BOLUS
INTRAVENOUS | Status: DC | PRN
  Administered 2014-09-22: 180 mg via INTRAVENOUS

## 2014-09-22 MED ORDER — ROCURONIUM BROMIDE 100 MG/10ML IV SOLN
INTRAVENOUS | Status: DC | PRN
  Administered 2014-09-22: 40 mg via INTRAVENOUS
  Administered 2014-09-22: 10 mg via INTRAVENOUS

## 2014-09-22 MED ORDER — KETOROLAC TROMETHAMINE 30 MG/ML IJ SOLN: 30.0000 mg | Freq: Four times a day (QID) | INTRAMUSCULAR | Status: DC

## 2014-09-22 MED ORDER — MIDAZOLAM HCL 2 MG/2ML IJ SOLN
INTRAMUSCULAR | Status: AC
  Filled 2014-09-22: qty 2

## 2014-09-22 MED ORDER — BUPIVACAINE HCL (PF) 0.25 % IJ SOLN
INTRAMUSCULAR | Status: DC | PRN
  Administered 2014-09-22: 20 mL

## 2014-09-22 SURGICAL SUPPLY — 33 items
CANISTER SUCT 3000ML (MISCELLANEOUS) ×3 IMPLANT
CLOTH BEACON ORANGE TIMEOUT ST (SAFETY) ×3 IMPLANT
DRAPE CESAREAN BIRTH W POUCH (DRAPES) ×3 IMPLANT
DRAPE WARM FLUID 44X44 (DRAPE) ×3 IMPLANT
DRSG OPSITE POSTOP 4X10 (GAUZE/BANDAGES/DRESSINGS) ×3 IMPLANT
DURAPREP 26ML APPLICATOR (WOUND CARE) ×3 IMPLANT
GAUZE SPONGE 4X4 16PLY XRAY LF (GAUZE/BANDAGES/DRESSINGS) ×3 IMPLANT
GLOVE BIO SURGEON STRL SZ8 (GLOVE) ×3 IMPLANT
GLOVE BIO SURGEON STRL SZ8.5 (GLOVE) ×3 IMPLANT
GLOVE BIOGEL PI IND STRL 7.0 (GLOVE) ×8 IMPLANT
GLOVE BIOGEL PI INDICATOR 7.0 (GLOVE) ×4
GOWN STRL REUS W/TWL LRG LVL3 (GOWN DISPOSABLE) ×6 IMPLANT
GOWN STRL REUS W/TWL XL LVL3 (GOWN DISPOSABLE) ×3 IMPLANT
LIQUID BAND (GAUZE/BANDAGES/DRESSINGS) ×3 IMPLANT
NEEDLE HYPO 22GX1.5 SAFETY (NEEDLE) ×3 IMPLANT
NS IRRIG 1000ML POUR BTL (IV SOLUTION) ×3 IMPLANT
PACK ABDOMINAL GYN (CUSTOM PROCEDURE TRAY) ×3 IMPLANT
PAD OB MATERNITY 4.3X12.25 (PERSONAL CARE ITEMS) ×3 IMPLANT
PROTECTOR NERVE ULNAR (MISCELLANEOUS) ×3 IMPLANT
SPONGE LAP 18X18 X RAY DECT (DISPOSABLE) ×6 IMPLANT
SUT CHROMIC 0 CT 1 (SUTURE) ×3 IMPLANT
SUT CHROMIC 1 CT1 27 (SUTURE) ×6 IMPLANT
SUT CHROMIC 1MO 4 18 CR8 (SUTURE) ×9 IMPLANT
SUT CHROMIC 2 0 CT 1 (SUTURE) ×3 IMPLANT
SUT CHROMIC GUT AB #0 18 (SUTURE) ×3 IMPLANT
SUT MON AB 4-0 PS1 27 (SUTURE) ×3 IMPLANT
SUT PDS AB 0 CT 36 (SUTURE) ×6 IMPLANT
SUT VIC AB 0 CT1 27 (SUTURE) ×1
SUT VIC AB 0 CT1 27XBRD ANBCTR (SUTURE) ×2 IMPLANT
SYR CONTROL 10ML LL (SYRINGE) ×3 IMPLANT
TOWEL OR 17X24 6PK STRL BLUE (TOWEL DISPOSABLE) ×6 IMPLANT
TRAY FOLEY CATH SILVER 14FR (SET/KITS/TRAYS/PACK) ×3 IMPLANT
WATER STERILE IRR 1000ML POUR (IV SOLUTION) ×3 IMPLANT

## 2014-09-22 NOTE — Anesthesia Preprocedure Evaluation (Signed)
Anesthesia Evaluation  Patient identified by MRN, date of birth, ID band Patient awake    Reviewed: Allergy & Precautions, H&P , NPO status , Patient's Chart, lab work & pertinent test results  Airway Mallampati: I  TM Distance: >3 FB Neck ROM: full    Dental no notable dental hx.    Pulmonary neg pulmonary ROS,    Pulmonary exam normal       Cardiovascular negative cardio ROS      Neuro/Psych negative neurological ROS  negative psych ROS   GI/Hepatic negative GI ROS, Neg liver ROS,   Endo/Other  negative endocrine ROS  Renal/GU negative Renal ROS     Musculoskeletal   Abdominal Normal abdominal exam  (+)   Peds  Hematology   Anesthesia Other Findings   Reproductive/Obstetrics negative OB ROS                             Anesthesia Physical Anesthesia Plan  ASA: II  Anesthesia Plan: General   Post-op Pain Management:    Induction: Intravenous  Airway Management Planned: Oral ETT  Additional Equipment:   Intra-op Plan:   Post-operative Plan: Extubation in OR  Informed Consent: I have reviewed the patients History and Physical, chart, labs and discussed the procedure including the risks, benefits and alternatives for the proposed anesthesia with the patient or authorized representative who has indicated his/her understanding and acceptance.   Dental advisory given  Plan Discussed with: CRNA, Anesthesiologist and Surgeon  Anesthesia Plan Comments:         Anesthesia Quick Evaluation

## 2014-09-22 NOTE — Anesthesia Postprocedure Evaluation (Signed)
  Anesthesia Post Note  Patient: Janet Park  Procedure(s) Performed: Procedure(s) (LRB): HYSTERECTOMY ABDOMINAL (N/A) BILATERAL SALPINGECTOMY  Anesthesia type: GA  Patient location: PACU  Post pain: Pain level controlled  Post assessment: Post-op Vital signs reviewed  Last Vitals:  Filed Vitals:   09/22/14 0635  BP: 121/72  Pulse: 75  Temp: 36.8 C  Resp: 18    Post vital signs: Reviewed  Level of consciousness: sedated  Complications: No apparent anesthesia complications

## 2014-09-22 NOTE — H&P (Signed)
Since the original dictation patient had exam under anesthesia and a  Cervical cone  Could not be done at that time she was seen by GYN oncology who evaluated her cervix and  Felt that she still that she only had severe dysplasia and not  A   Cancer of the cervix

## 2014-09-22 NOTE — Anesthesia Postprocedure Evaluation (Signed)
  Anesthesia Post-op Note  Patient: Janet Park  Procedure(s) Performed: Procedure(s): HYSTERECTOMY ABDOMINAL (N/A) BILATERAL SALPINGECTOMY  Patient Location: Women's Unit  Anesthesia Type:General  Level of Consciousness: awake, alert , oriented and patient cooperative  Airway and Oxygen Therapy: Patient Spontanous Breathing and Patient connected to nasal cannula oxygen  Post-op Pain: mild  Post-op Assessment: Post-op Vital signs reviewed, Patient's Cardiovascular Status Stable, Respiratory Function Stable, Patent Airway and No signs of Nausea or vomiting  Post-op Vital Signs: Reviewed and stable  Last Vitals:  Filed Vitals:   09/22/14 1223  BP: 114/65  Pulse: 86  Temp: 37.1 C  Resp: 16    Complications: No apparent anesthesia complications

## 2014-09-22 NOTE — Transfer of Care (Signed)
Immediate Anesthesia Transfer of Care Note  Patient: Janet Park  Procedure(s) Performed: Procedure(s): HYSTERECTOMY ABDOMINAL (N/A) BILATERAL SALPINGECTOMY  Patient Location: PACU  Anesthesia Type:General  Level of Consciousness: awake  Airway & Oxygen Therapy: Patient Spontanous Breathing  Post-op Assessment: Report given to PACU RN  Post vital signs: stable  Filed Vitals:   09/22/14 0635  BP: 121/72  Pulse: 75  Temp: 36.8 C  Resp: 18    Complications: No apparent anesthesia complications

## 2014-09-22 NOTE — Addendum Note (Signed)
Addendum  created 09/22/14 1317 by Raenette Rover, CRNA   Modules edited: Notes Section   Notes Section:  File: [id number]

## 2014-09-22 NOTE — Op Note (Signed)
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 Gracy Racer First assistant Dr. 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 the right round ligament was grasped with a Kelly clamp cut suture ligated with #1 chromic procedure done in a similar fashion on the  Other side  the utero-ovarian ligament  Grasped  on the right Kelly clamp cut suture ligated with #1 chromic procedure done in a similar fashion on the  Other side  using the Metzenbaums the bladder flap was developed and the bladder dissected off of the cervix the uterine vessels on the right were identified clamp 2 cut and sutured with #1 chromic 2 procedure done in a similar fashion the  Other side  the uterus was removed with a scalpel at the cervical uterine junction using straight Coker clamps the uterine and cardinal ligaments and uterosacral ligaments  Grasped  in the right sutured with #1 chromic procedure done in a similar fashion the side cervix was removed at the cervicovaginal junction with the Mayo scissors and interrupted sutures of #1 chromic used to close the vagina using the Kelly clamp the right tube was  Grasped  cut suture ligated #1 chromic procedure done in a similar fashion the other side hemostasis was satisfactory blood loss 150 cc abdomen closed in layers peritoneum continuous with of 0 chromic fascia continuous with of 0 Dexon and the skin closes subcuticular stitch of 4-0 Monocryl patient tolerated procedure well taken to recovery room in good condition thank you

## 2014-09-23 ENCOUNTER — Encounter (HOSPITAL_COMMUNITY): Payer: Self-pay | Admitting: Obstetrics

## 2014-09-23 LAB — CBC
HEMATOCRIT: 28.4 % — AB (ref 36.0–46.0)
HEMOGLOBIN: 8.8 g/dL — AB (ref 12.0–15.0)
MCH: 24.4 pg — AB (ref 26.0–34.0)
MCHC: 31 g/dL (ref 30.0–36.0)
MCV: 78.7 fL (ref 78.0–100.0)
Platelets: 275 10*3/uL (ref 150–400)
RBC: 3.61 MIL/uL — AB (ref 3.87–5.11)
RDW: 13 % (ref 11.5–15.5)
WBC: 11.7 10*3/uL — AB (ref 4.0–10.5)

## 2014-09-23 NOTE — Progress Notes (Signed)
Patient ID: Janet Park, female   DOB: 1976-05-19, 39 y.o.   MRN: [id number] Postop day 1 Blood pressure 120/68 respiration 16 pulse 82 temp 99.8  Output  good Patient has no complaints Abdomen is soft  Bowel sounds  present incision is clean and dry legs are negative Doing well Home as soon as she passes flatus

## 2014-09-24 LAB — CBC WITH DIFFERENTIAL/PLATELET
BASOS ABS: 0 10*3/uL (ref 0.0–0.1)
Basophils Relative: 0 % (ref 0–1)
EOS PCT: 2 % (ref 0–5)
Eosinophils Absolute: 0.2 10*3/uL (ref 0.0–0.7)
HCT: 28.8 % — ABNORMAL LOW (ref 36.0–46.0)
Hemoglobin: 8.9 g/dL — ABNORMAL LOW (ref 12.0–15.0)
LYMPHS ABS: 2 10*3/uL (ref 0.7–4.0)
LYMPHS PCT: 16 % (ref 12–46)
MCH: 24.3 pg — ABNORMAL LOW (ref 26.0–34.0)
MCHC: 30.9 g/dL (ref 30.0–36.0)
MCV: 78.7 fL (ref 78.0–100.0)
Monocytes Absolute: 1.2 10*3/uL — ABNORMAL HIGH (ref 0.1–1.0)
Monocytes Relative: 10 % (ref 3–12)
NEUTROS ABS: 8.6 10*3/uL — AB (ref 1.7–7.7)
NEUTROS PCT: 72 % (ref 43–77)
PLATELETS: 315 10*3/uL (ref 150–400)
RBC: 3.66 MIL/uL — ABNORMAL LOW (ref 3.87–5.11)
RDW: 13.1 % (ref 11.5–15.5)
WBC: 12 10*3/uL — AB (ref 4.0–10.5)

## 2014-09-24 MED ORDER — SODIUM CHLORIDE 0.9 % IV SOLN
3.0000 g | Freq: Four times a day (QID) | INTRAVENOUS | Status: DC
  Administered 2014-09-24 – 2014-09-26 (×6): 3 g via INTRAVENOUS
  Filled 2014-09-24 (×8): qty 3

## 2014-09-24 MED ORDER — ACETAMINOPHEN 325 MG PO TABS: 650.0000 mg | ORAL_TABLET | ORAL | Status: DC | PRN

## 2014-09-24 MED ORDER — LACTATED RINGERS IV SOLN
INTRAVENOUS | Status: DC
  Administered 2014-09-24 – 2014-09-26 (×4): via INTRAVENOUS

## 2014-09-24 MED ORDER — BISACODYL 10 MG RE SUPP
10.0000 mg | Freq: Every day | RECTAL | Status: DC | PRN
  Administered 2014-09-24: 10 mg via RECTAL
  Filled 2014-09-24: qty 1

## 2014-09-24 NOTE — Progress Notes (Signed)
Patient ID: Janet Park, female   DOB: 03/05/1976, 39 y.o.   MRN: [id number] Postop day 2 blood pressure 125/73 pulse 100 hemoglobin was 8.8 Abdomen soft bowel sounds present Legs negative Doing well

## 2014-09-25 LAB — URINALYSIS, ROUTINE W REFLEX MICROSCOPIC
BILIRUBIN URINE: NEGATIVE
GLUCOSE, UA: NEGATIVE mg/dL
HGB URINE DIPSTICK: NEGATIVE
Ketones, ur: 15 mg/dL — AB
LEUKOCYTES UA: NEGATIVE
NITRITE: NEGATIVE
PH: 6 (ref 5.0–8.0)
Protein, ur: 30 mg/dL — AB
SPECIFIC GRAVITY, URINE: 1.025 (ref 1.005–1.030)
UROBILINOGEN UA: 2 mg/dL — AB (ref 0.0–1.0)

## 2014-09-25 LAB — URINE MICROSCOPIC-ADD ON

## 2014-09-25 MED ORDER — FLUCONAZOLE 150 MG PO TABS
150.0000 mg | ORAL_TABLET | Freq: Once | ORAL | Status: AC
Start: 2014-09-25 — End: 2014-09-25
  Administered 2014-09-25: 150 mg via ORAL
  Filled 2014-09-25: qty 1

## 2014-09-25 MED ORDER — IBUPROFEN 600 MG PO TABS
600.0000 mg | ORAL_TABLET | Freq: Four times a day (QID) | ORAL | Status: DC | PRN
  Administered 2014-09-25 – 2014-09-26 (×2): 600 mg via ORAL
  Filled 2014-09-25 (×2): qty 1

## 2014-09-25 NOTE — Progress Notes (Signed)
Patient ID: Janet Park, female   DOB: 02/22/1976, 39 y.o.   MRN: [id number] Postop day 4 Blood pressures 157 respirations 18 pulse 80 40 white count was 12 up from 11.8 urine negative her output is good and she has no complaints Yesterday highest temp was 101.3 and she was started on Unasyn 3 g IV every 6 hours Her lungs are clear abdomen soft good bowel sounds she had 3 bowel movements she has no CVA tenderness her incision is clean and dry legs are negative Improving Will continue antibiotics for another 24 hours before discharge

## 2014-09-26 NOTE — Discharge Summary (Signed)
Patient's a 39 year old female   gravida 1 para 1 who had large myoma uteri who was admitted and had a TAH bilateral salpingectomy on 46 postop she did well except for day 2 she had a temp of 101 and her white count was 12 up from 11.8 she receive Unasyn 3 g IV every 6 hours and remained afebrile she passed gas and had 2 bowel movements and her abdomen remained nice and soft lungs clear no CVA tenderness negative urine and was discharged on day 4 on Percocet for pain to see me in 4 weeks

## 2014-09-26 NOTE — Progress Notes (Signed)
Discharge teaching complete. Pt understood all instructions and did not have any questions. Pt ambulated out of the hospital and discharged home to family.

## 2014-09-26 NOTE — Discharge Instructions (Signed)
Hysterectomy, Abdominal  Care After Please read the instructions below. Refer to these instructions for the next few weeks. These instructions provide you with general information on caring for yourself after surgery. Your caregiver may also give you specific instructions. While your treatment has been planned according to the most current medical practices available, unavoidable problems sometimes happen. If you have any problems or questions after you leave, please call your caregiver. HOME CARE INSTRUCTIONS  Healing will take time. You will have discomfort, tenderness, swelling and bruising at the operative site for a couple of weeks. This is normal and will get better as time goes on.   Only take over-the-counter or prescription medicines for pain, discomfort or fever as directed by your caregiver.   Do not take aspirin. It can cause bleeding.   Do not drive when taking pain medication.   Follow your caregiver's advice regarding diet, exercise, lifting, driving and general activities.   Resume your usual diet as directed and allowed.   Get plenty of rest and sleep.   Do not douche, use tampons, or have sexual intercourse until your caregiver gives you permission.   Change your bandages (dressings) as directed.   Take your temperature twice a day. Write it down.   Your caregiver may recommend showers instead of baths for a few weeks.   Do not drink alcohol until your caregiver gives you permission.   If you develop constipation, you may take a mild laxative with your caregiver's permission. Bran foods and drinking fluids helps with constipation problems.   Try to have someone home with you for a week or two to help with the household activities.   Make sure you and your family understands everything about your operation and recovery.   Do not sign any legal documents until you feel normal again.   Keep all your follow-up appointments as recommended by your caregiver.  SEEK  MEDICAL CARE IF:   There is swelling, redness or increasing pain in the wound area.   Pus is coming from the wound.   You notice a bad smell from the wound or surgical dressing.   You have pain, redness and swelling from the intravenous site.   The wound is breaking open (the edges are not staying together).   You feel dizzy or feel like fainting.   You develop pain or bleeding when you urinate.   You develop diarrhea.   You develop nausea and vomiting.   You develop abnormal vaginal discharge.   You develop a rash.   You have any type of abnormal reaction or develop an allergy to your medication.   You need stronger pain medication for your pain.  SEEK IMMEDIATE MEDICAL CARE IF:  You have a fever.   You develop abdominal pain.   You develop chest pain.   You develop shortness of breath.   You pass out.   You develop pain, swelling or redness of your leg.   You develop heavy vaginal bleeding with or without blood clots.  Document Released: 12/22/2004 Document Revised: 12/08/2010 Document Reviewed: 03/06/2009 [facility] Patient Information 2012 [place].

## 2014-09-26 NOTE — Progress Notes (Signed)
Patient ID: Janet Park, female   DOB: 1975/11/28, 39 y.o.   MRN: [id number] Postop day 4  remains afebrile she had one temperature elevation of 101 and got Unasyn and since then she has passed flatus had bowel movements and feeling fine her abdomen is soft incision is clean and dry legs are negative she been discharged today on Percocet to see me in 4 weeks

## 2014-09-29 ENCOUNTER — Inpatient Hospital Stay (HOSPITAL_COMMUNITY)
Admission: EM | Admit: 2014-09-29 | Discharge: 2014-09-29 | Disposition: A | Discharge status: Home / Self Care | Payer: Medicaid Other | Source: Ambulatory Visit | Attending: Obstetrics | Admitting: Obstetrics

## 2014-09-29 ENCOUNTER — Encounter (HOSPITAL_COMMUNITY): Payer: Self-pay

## 2014-09-29 DIAGNOSIS — R300 Dysuria: Secondary | ICD-10-CM

## 2014-09-29 DIAGNOSIS — N390 Urinary tract infection, site not specified: Secondary | ICD-10-CM | POA: Diagnosis not present

## 2014-09-29 LAB — URINALYSIS, ROUTINE W REFLEX MICROSCOPIC
BILIRUBIN URINE: NEGATIVE
Glucose, UA: NEGATIVE mg/dL
Ketones, ur: NEGATIVE mg/dL
Nitrite: NEGATIVE
PH: 6 (ref 5.0–8.0)
Protein, ur: NEGATIVE mg/dL
Specific Gravity, Urine: 1.02 (ref 1.005–1.030)
Urobilinogen, UA: 1 mg/dL (ref 0.0–1.0)

## 2014-09-29 LAB — URINE MICROSCOPIC-ADD ON

## 2014-09-29 LAB — POCT PREGNANCY, URINE: Preg Test, Ur: NEGATIVE

## 2014-09-29 MED ORDER — PHENAZOPYRIDINE HCL 200 MG PO TABS: 200.0000 mg | ORAL_TABLET | Freq: Three times a day (TID) | ORAL | Status: DC

## 2014-09-29 MED ORDER — PHENAZOPYRIDINE HCL 100 MG PO TABS
200.0000 mg | ORAL_TABLET | Freq: Once | ORAL | Status: AC
  Administered 2014-09-29: 200 mg via ORAL
  Filled 2014-09-29: qty 2

## 2014-09-29 MED ORDER — SULFAMETHOXAZOLE-TRIMETHOPRIM 800-160 MG PO TABS: 1.0000 | ORAL_TABLET | Freq: Two times a day (BID) | ORAL | Status: AC

## 2014-09-29 NOTE — Discharge Instructions (Signed)
Antibiotic Medication °Antibiotic medicine helps fight germs. Germs cause infections. This type of medicine will not work for colds, flu, or other viral infections. Tell your doctor if you: °· Are allergic to any medicines. °· Are pregnant or are trying to get pregnant. °· Are taking other medicines. °· Have other medical problems. °HOME CARE °· Take your medicine with a glass of water or food as told by your doctor. °· Take the medicine as told. Finish them even if you start to feel better. °· Do not give your medicine to other people. °· Do not use your medicine in the future for a different infection. °· Ask your doctor about which side effects to watch for. °· Try not to miss any doses. If you miss a dose, take it as soon as possible. If it is almost time for your next dose, and your dosing schedule is: °¨ Two doses a day, take the missed dose and the next dose 5 to 6 hours later. °¨ Three or more doses a day, take the missed dose and the next dose 2 to 4 hours later, or double your next dose. °¨ Then go back to your normal schedule. °GET HELP RIGHT AWAY IF:  °· You get worse or do not get better within a few days. °· The medicine makes you sick. °· You develop a rash or any other side effects. °· You have questions or concerns. °MAKE SURE YOU: °· Understand these instructions. °· Will watch your condition. °· Will get help right away if you are not doing well or get worse. °Document Released: 03/13/2008 Document Revised: 08/27/2011 Document Reviewed: 05/10/2009 °Exit[employer]® Patient Information ©2015 [employer]. This information is not intended to replace advice given to you by your health care provider. Make sure you discuss any questions you have with your health care provider. ° °

## 2014-09-29 NOTE — MAU Note (Signed)
Patient had partial abdominal hysterectomy on 09/22/14 was discharged on Sunday. Burning with urination started yesterday with blood in urine.  This morning when she strained with bowel movement she could see blood in toilet.

## 2014-09-29 NOTE — MAU Provider Note (Signed)
History     CSN: [id number]  Arrival date and time: 09/29/14 8676   First Provider Initiated Contact with Patient 09/29/14 [phone]      Chief Complaint  Patient presents with  . Dysuria   HPI  [name] is a 39 y.o. female No obstetric history on file. Who presents with dyuria and abdominal pressure. She underwent a hysterectomy last Wednesday; done by Dr. Ruthann Cancer. She went home on Sunday; uncomplicated procedure. Last evening she started noticing the symptoms which included; pressure, pain with urination, burning with urination worse at the end of urination. The pressure is constant. She has also noted a scant amount of red blood when she wipes. She feels she has a UTI; she has not tried anything over the counter for the discomfort.   Abdomianal pressure she rates a 5-6/10.     OB History    No data available      Past Medical History  Diagnosis Date  . Anemia   . Fibroid   . SVD (spontaneous vaginal delivery)     x 1    Past Surgical History  Procedure Laterality Date  . Cervical conization w/bx N/A 08/25/2014    Procedure: ATTEMPTED CONIZATION CERVIX ;  Surgeon: Frederico Hamman, MD;  Location: [facility];  Service: Gynecology;  Laterality: N/A;  . Abdominal hysterectomy N/A 09/22/2014    Procedure: HYSTERECTOMY ABDOMINAL;  Surgeon: Frederico Hamman, MD;  Location: [facility];  Service: Gynecology;  Laterality: N/A;  . Bilateral salpingectomy  09/22/2014    Procedure: BILATERAL SALPINGECTOMY;  Surgeon: Frederico Hamman, MD;  Location: [facility];  Service: Gynecology;;    Family History  Problem Relation Age of Onset  . Kidney disease Father     History  Substance Use Topics  . Smoking status: Never Smoker   . Smokeless tobacco: Never Used  . Alcohol Use: No    Allergies: No Known Allergies  Prescriptions prior to admission  Medication Sig Dispense Refill Last Dose  . fluconazole (DIFLUCAN) 150 MG tablet Take 150 mg by mouth See admin instructions. 1 tablet,  then repeat in two days   09/26/2014  . ibuprofen (ADVIL,MOTRIN) 200 MG tablet Take 200 mg by mouth every 4 (four) hours as needed for moderate pain.   09/28/2014 at Unknown time  . oxyCODONE-acetaminophen (PERCOCET/ROXICET) 5-325 MG per tablet Take 1 tablet by mouth every 4 (four) hours as needed for severe pain.   09/29/2014 at Unknown time   Results for orders placed or performed during the hospital encounter of 09/29/14 (from the past 48 hour(s))  Urinalysis, Routine w reflex microscopic     Status: Abnormal   Collection Time: 09/29/14  8:55 AM  Result Value Ref Range   Color, Urine YELLOW YELLOW   APPearance CLEAR CLEAR   Specific Gravity, Urine 1.020 1.005 - 1.030   pH 6.0 5.0 - 8.0   Glucose, UA NEGATIVE NEGATIVE mg/dL   Hgb urine dipstick MODERATE (A) NEGATIVE   Bilirubin Urine NEGATIVE NEGATIVE   Ketones, ur NEGATIVE NEGATIVE mg/dL   Protein, ur NEGATIVE NEGATIVE mg/dL   Urobilinogen, UA 1.0 0.0 - 1.0 mg/dL   Nitrite NEGATIVE NEGATIVE   Leukocytes, UA SMALL (A) NEGATIVE  Urine microscopic-add on     Status: Abnormal   Collection Time: 09/29/14  8:55 AM  Result Value Ref Range   Squamous Epithelial / LPF MANY (A) RARE   WBC, UA 3-6 <3 WBC/hpf   RBC / HPF 0-2 <3 RBC/hpf   Bacteria, UA  RARE RARE   Urine-Other MUCOUS PRESENT   Pregnancy, urine POC     Status: None   Collection Time: 09/29/14  9:27 AM  Result Value Ref Range   Preg Test, Ur NEGATIVE NEGATIVE    Comment:        THE SENSITIVITY OF THIS METHODOLOGY IS >24 mIU/mL     Review of Systems  Constitutional: Negative for fever.  Genitourinary: Positive for dysuria, urgency, frequency and hematuria. Negative for flank pain.  Musculoskeletal: Negative for back pain.   Physical Exam   Blood pressure 117/75, pulse 75, temperature 98.4 F (36.9 C), temperature source Oral, resp. rate 16, height 5\' 8"  (1.727 m), weight 81.194 kg (179 lb), last menstrual period 09/09/2014.  Physical Exam  Constitutional: She is  oriented to person, place, and time. She appears well-developed and well-nourished. No distress.  HENT:  Head: Normocephalic.  Eyes: Pupils are equal, round, and reactive to light.  Neck: Neck supple.  Respiratory: Effort normal and breath sounds normal.  GI: There is tenderness in the right lower quadrant, suprapubic area and left lower quadrant.    Musculoskeletal: Normal range of motion.  Neurological: She is alert and oriented to person, place, and time.  Skin: Skin is warm. She is not diaphoretic.  Psychiatric: Her behavior is normal.    MAU Course  Procedures  None  MDM Pyridium 200 mg PO in MAU  Urine culture pending   Assessment and Plan   A:  1. UTI (lower urinary tract infection)   2. Dysuria      P:  Discharge home in stable condition RX: bactrim, pyridium Return to MAU if symptoms worsen Follow up with Dr. Ruthann Cancer as needed, or as scheduled   Lezlie Lye, NP 09/29/2014 9:42 AM

## 2014-10-01 LAB — URINE CULTURE
Colony Count: 15000
SPECIAL REQUESTS: NORMAL

## 2015-08-12 ENCOUNTER — Other Ambulatory Visit: Payer: Self-pay | Admitting: Internal Medicine

## 2015-08-12 DIAGNOSIS — Z1231 Encounter for screening mammogram for malignant neoplasm of breast: Secondary | ICD-10-CM

## 2015-10-04 ENCOUNTER — Ambulatory Visit
Admission: RE | Admit: 2015-10-04 | Discharge: 2015-10-04 | Disposition: A | Discharge status: Home / Self Care | Payer: Medicaid Other | Source: Ambulatory Visit | Attending: Internal Medicine | Admitting: Internal Medicine

## 2015-10-04 DIAGNOSIS — Z1231 Encounter for screening mammogram for malignant neoplasm of breast: Secondary | ICD-10-CM

## 2016-03-10 ENCOUNTER — Encounter: Payer: Self-pay | Admitting: *Deleted

## 2016-03-10 LAB — PROCEDURE REPORT - SCANNED: PAP SMEAR: ABNORMAL — AB

## 2016-08-19 ENCOUNTER — Emergency Department (HOSPITAL_COMMUNITY)
Admission: EM | Admit: 2016-08-19 | Discharge: 2016-08-19 | Disposition: A | Discharge status: Home / Self Care | Payer: Medicaid Other | Attending: Emergency Medicine | Admitting: Emergency Medicine

## 2016-08-19 ENCOUNTER — Encounter (HOSPITAL_COMMUNITY): Payer: Self-pay

## 2016-08-19 DIAGNOSIS — K2970 Gastritis, unspecified, without bleeding: Secondary | ICD-10-CM | POA: Insufficient documentation

## 2016-08-19 DIAGNOSIS — R1010 Upper abdominal pain, unspecified: Secondary | ICD-10-CM | POA: Diagnosis present

## 2016-08-19 LAB — COMPREHENSIVE METABOLIC PANEL
ALK PHOS: 61 U/L (ref 38–126)
ALT: 11 U/L — AB (ref 14–54)
AST: 16 U/L (ref 15–41)
Albumin: 4.5 g/dL (ref 3.5–5.0)
Anion gap: 6 (ref 5–15)
BILIRUBIN TOTAL: 0.1 mg/dL — AB (ref 0.3–1.2)
BUN: 15 mg/dL (ref 6–20)
CALCIUM: 9.4 mg/dL (ref 8.9–10.3)
CO2: 29 mmol/L (ref 22–32)
CREATININE: 0.86 mg/dL (ref 0.44–1.00)
Chloride: 105 mmol/L (ref 101–111)
GFR calc Af Amer: >60 mL/min (ref >60)
GFR calc non Af Amer: >60 mL/min (ref >60)
Glucose, Bld: 89 mg/dL (ref 65–99)
Potassium: 3.6 mmol/L (ref 3.5–5.1)
Sodium: 140 mmol/L (ref 135–145)
Total Protein: 7.8 g/dL (ref 6.5–8.1)

## 2016-08-19 LAB — CBC
HCT: 33.6 % — ABNORMAL LOW (ref 36.0–46.0)
Hemoglobin: 10.4 g/dL — ABNORMAL LOW (ref 12.0–15.0)
MCH: 24.1 pg — ABNORMAL LOW (ref 26.0–34.0)
MCHC: 31 g/dL (ref 30.0–36.0)
MCV: 77.8 fL — ABNORMAL LOW (ref 78.0–100.0)
PLATELETS: 323 10*3/uL (ref 150–400)
RBC: 4.32 MIL/uL (ref 3.87–5.11)
RDW: 13 % (ref 11.5–15.5)
WBC: 6.6 10*3/uL (ref 4.0–10.5)

## 2016-08-19 LAB — LIPASE, BLOOD: Lipase: 20 U/L (ref 11–51)

## 2016-08-19 LAB — I-STAT BETA HCG BLOOD, ED (MC, WL, AP ONLY): I-stat hCG, quantitative: <5 m[IU]/mL (ref <5)

## 2016-08-19 LAB — RAPID STREP SCREEN (MED CTR MEBANE ONLY): STREPTOCOCCUS, GROUP A SCREEN (DIRECT): NEGATIVE

## 2016-08-19 NOTE — ED Triage Notes (Addendum)
Pt report intermittent abdominal pain and headache starting last week. She is also reporting her throat feeling different. She denies pain, but states that she has been experiencing a terrible odor coming from her throat since last week that she cannot get rid off. A&Ox4. Ambulatory.

## 2016-08-19 NOTE — ED Notes (Signed)
PT DISCHARGED. INSTRUCTIONS GIVEN. AAOX4. PT IN NO APPARENT DISTRESS OR PAIN. THE OPPORTUNITY TO ASK QUESTIONS WAS PROVIDED. 

## 2016-08-19 NOTE — Discharge Instructions (Signed)
Try using an antacid such as Maalox or Mylanta before meals and at bedtime to see if it helps the order you are experiencing in your mouth.  If this does not help, you will likely need to see a gastroenterologist, for further evaluation and possible upper endoscopy.

## 2016-08-19 NOTE — ED Provider Notes (Signed)
[facility] DEPT Provider Note   CSN: [id number] Arrival date & time: 08/19/16  1722     History   Chief Complaint Chief Complaint  Patient presents with  . Abdominal Pain  . Throat Odor    HPI Janet Park is a 41 y.o. female.  She is here for evaluation of an odor in her mouth present for 2 weeks, without known cause.  She also has pain in her upper abdomen, and pain with defecation.  Otherwise bowel and urinary habits are normal.  She denies fever chills nausea vomiting cough chest pain weakness or dizziness.  She is not trying any medication for the problem.   HPI  Past Medical History:  Diagnosis Date  . Anemia   . Fibroid   . SVD (spontaneous vaginal delivery)    x 1    Patient Active Problem List   Diagnosis Date Noted  . Uterine myoma 09/22/2014  . Leiomyoma of uterus, unspecified 02/11/2013    Past Surgical History:  Procedure Laterality Date  . ABDOMINAL HYSTERECTOMY N/A 09/22/2014   Procedure: HYSTERECTOMY ABDOMINAL;  Surgeon: Frederico Hamman, MD;  Location: [facility];  Service: Gynecology;  Laterality: N/A;  . BILATERAL SALPINGECTOMY  09/22/2014   Procedure: BILATERAL SALPINGECTOMY;  Surgeon: Frederico Hamman, MD;  Location: [facility];  Service: Gynecology;;  . CERVICAL CONIZATION W/BX N/A 08/25/2014   Procedure: ATTEMPTED CONIZATION CERVIX ;  Surgeon: Frederico Hamman, MD;  Location: [facility];  Service: Gynecology;  Laterality: N/A;    OB History    No data available       Home Medications    Prior to Admission medications   Medication Sig Start Date End Date Taking? Authorizing Provider  fluconazole (DIFLUCAN) 150 MG tablet Take 150 mg by mouth See admin instructions. 1 tablet, then repeat in two days    Historical Provider, MD  ibuprofen (ADVIL,MOTRIN) 200 MG tablet Take 200 mg by mouth every 4 (four) hours as needed for moderate pain.    Historical Provider, MD  oxyCODONE-acetaminophen (PERCOCET/ROXICET) 5-325 MG per tablet Take 1 tablet by mouth  every 4 (four) hours as needed for severe pain.    Historical Provider, MD  phenazopyridine (PYRIDIUM) 200 MG tablet Take 1 tablet (200 mg total) by mouth 3 (three) times daily. 09/29/14   Lezlie Lye, NP    Family History Family History  Problem Relation Age of Onset  . Kidney disease Father     Social History Social History  Substance Use Topics  . Smoking status: Never Smoker  . Smokeless tobacco: Never Used  . Alcohol use No     Allergies   Patient has no known allergies.   Review of Systems Review of Systems  All other systems reviewed and are negative.    Physical Exam Updated Vital Signs BP 126/87 (BP Location: Left Arm)   Pulse 78   Temp 97.5 F (36.4 C) (Oral)   Resp 18   Ht 5\' 8"  (1.727 m)   Wt 169 lb 9.6 oz (76.9 kg)   SpO2 100%   BMI 25.79 kg/m   Physical Exam  Constitutional: She is oriented to person, place, and time. She appears well-developed and well-nourished. No distress.  HENT:  Head: Normocephalic and atraumatic.  Normal oropharynx.  No oral odor is detected.  Eyes: Conjunctivae and EOM are normal. Pupils are equal, round, and reactive to light.  Neck: Normal range of motion and phonation normal. Neck supple.  Cardiovascular: Normal rate and regular rhythm.  Pulmonary/Chest: Effort normal and breath sounds normal. She exhibits no tenderness.  Abdominal: Soft. She exhibits no distension. There is no tenderness. There is no guarding.  Musculoskeletal: Normal range of motion.  Neurological: She is alert and oriented to person, place, and time. She exhibits normal muscle tone.  Skin: Skin is warm and dry.  Psychiatric: She has a normal mood and affect. Her behavior is normal. Judgment and thought content normal.  Nursing note and vitals reviewed.    ED Treatments / Results  Labs (all labs ordered are listed, but only abnormal results are displayed) Labs Reviewed  COMPREHENSIVE METABOLIC PANEL - Abnormal; Notable for the following:        Result Value   ALT 11 (*)    Total Bilirubin 0.1 (*)    All other components within normal limits  CBC - Abnormal; Notable for the following:    Hemoglobin 10.4 (*)    HCT 33.6 (*)    MCV 77.8 (*)    MCH 24.1 (*)    All other components within normal limits  RAPID STREP SCREEN (NOT AT [facility])  CULTURE, GROUP A STREP ([place])  LIPASE, BLOOD  URINALYSIS, ROUTINE W REFLEX MICROSCOPIC  I-STAT BETA HCG BLOOD, ED ([facility], [facility], [facility] ONLY)    EKG  EKG Interpretation None       Radiology No results found.  Procedures Procedures (including critical care time)  Medications Ordered in ED Medications - No data to display   Initial Impression / Assessment and Plan / ED Course  I have reviewed the triage vital signs and the nursing notes.  Pertinent labs & imaging results that were available during my care of the patient were reviewed by me and considered in my medical decision making (see chart for details).     Medications - No data to display  Patient Vitals for the past 24 hrs:  BP Temp Temp src Pulse Resp SpO2 Height Weight  08/19/16 1727 126/87 97.5 F (36.4 C) Oral 78 18 100 % 5\' 8"  (1.727 m) 169 lb 9.6 oz (76.9 kg)    6:40 PM Reevaluation with update and discussion. After initial assessment and treatment, an updated evaluation reveals no change in clinical status.  Findings discussed with patient and all questions answered. Aleese Kamps L    Final Clinical Impressions(s) / ED Diagnoses   Final diagnoses:  Gastritis without bleeding, unspecified chronicity, unspecified gastritis type   Patient complaining of motor without deficit disorder, and normal examination.  Possible gastritis or reflux causing this experience.  No evidence for serious bacterial infection metabolic instability or suggestion for impending vascular collapse.  Nursing Notes Reviewed/ Care Coordinated Applicable Imaging Reviewed Interpretation of Laboratory Data incorporated into ED treatment  The  patient appears reasonably screened and/or stabilized for discharge and I doubt any other medical condition or other [facility] requiring further screening, evaluation, or treatment in the ED at this time prior to discharge.  Plan: Home Medications-OTC antacid liquid before meals at bedtime; Home Treatments-rest; return here if the recommended treatment, does not improve the symptoms; Recommended follow up-GI follow-up as needed   New Prescriptions New Prescriptions   No medications on file     Daleen Bo, MD 08/19/16 [phone]

## 2016-08-20 ENCOUNTER — Encounter: Payer: Self-pay | Admitting: Nurse Practitioner

## 2016-08-22 LAB — CULTURE, GROUP A STREP (THRC)

## 2016-08-29 ENCOUNTER — Ambulatory Visit (INDEPENDENT_AMBULATORY_CARE_PROVIDER_SITE_OTHER): Payer: Medicaid Other | Admitting: Nurse Practitioner

## 2016-08-29 ENCOUNTER — Other Ambulatory Visit (INDEPENDENT_AMBULATORY_CARE_PROVIDER_SITE_OTHER): Payer: Self-pay

## 2016-08-29 ENCOUNTER — Encounter: Payer: Self-pay | Admitting: Nurse Practitioner

## 2016-08-29 VITALS — BP 120/80 | HR 92 | Ht 68.0 in | Wt 169.4 lb

## 2016-08-29 DIAGNOSIS — K5900 Constipation, unspecified: Secondary | ICD-10-CM

## 2016-08-29 DIAGNOSIS — R1030 Lower abdominal pain, unspecified: Secondary | ICD-10-CM

## 2016-08-29 DIAGNOSIS — D509 Iron deficiency anemia, unspecified: Secondary | ICD-10-CM | POA: Diagnosis not present

## 2016-08-29 LAB — IBC PANEL
IRON: 66 ug/dL (ref 42–145)
SATURATION RATIOS: 17.9 % — AB (ref 20.0–50.0)
TRANSFERRIN: 263 mg/dL (ref 212.0–360.0)

## 2016-08-29 LAB — B12 AND FOLATE PANEL
Folate: 14.7 ng/mL (ref >5.9)
Vitamin B-12: 422 pg/mL (ref 211–911)

## 2016-08-29 LAB — FERRITIN: FERRITIN: 65.2 ng/mL (ref 10.0–291.0)

## 2016-08-29 MED ORDER — NA SULFATE-K SULFATE-MG SULF 17.5-3.13-1.6 GM/177ML PO SOLN: 1.0000 | Freq: Once | ORAL | 0 refills | Status: AC

## 2016-08-29 MED ORDER — DICYCLOMINE HCL 20 MG PO TABS: 20.0000 mg | ORAL_TABLET | Freq: Two times a day (BID) | ORAL | 0 refills | Status: AC | PRN

## 2016-08-29 NOTE — Patient Instructions (Signed)
If you are age 42 or older, your body mass index should be between 23-30. Your Body mass index is 25.76 kg/m. If this is out of the aforementioned range listed, please consider follow up with your Primary Care Provider.  If you are age 97 or younger, your body mass index should be between 19-25. Your Body mass index is 25.76 kg/m. If this is out of the aformentioned range listed, please consider follow up with your Primary Care Provider.   You have been scheduled for a colonoscopy. Please follow written instructions given to you at your visit today.  Please pick up your prep supplies at the pharmacy within the next 1-3 days. If you use inhalers (even only as needed), please bring them with you on the day of your procedure. Your physician has requested that you go to www.startemmi.com and enter the access code given to you at your visit today. This web site gives a general overview about your procedure. However, you should still follow specific instructions given to you by our office regarding your preparation for the procedure.  Thank you for choosing [facility]  Dr Wilfrid Lund III

## 2016-08-29 NOTE — Progress Notes (Signed)
HPI:  Patient is 41 year old female from [place], self-referred for evaluation of abdominal pain and constipation. Approximately 4 months ago patient became acutely constipated with no bowel movement in 7 days. She was in the process of traveling at the time. On the plane patient developed severe abdominal pain that she actually had what sounds a syncopal episode with subsequent explosive bowel movement.  Since then, every time she travels patient becomes constipated with severe lower abdominal cramping. When not traveling her bowels are more regular. She still has abdominal cramps but they are mainly associated with eating and are not nearly as intense. No urinary symptoms.  Defecation always relieves the cramps, at least temporarily.  Over the last week her stools have been extremely malodorous and she was recently seen in the ED for a foul taste / odor in her mouth.  She has no GERD symptoms. Started taking Gaviscon and has noticed improvement in the odor. Patient hasn't had any blood in her stool or unexplained weight loss. She has no upper GI symptoms. Prior to 4 months ago her bowel movements were normal. She mentions new development of visual disturbances. Lately having periods where she can not focus. She wears glasses.   Labs from 08/19/16 pertinent for hemoglobin of 10.4 with MCV of only 77. Chem profile unremarkable. Of note patient had a hysterectomy 2 years ago for large fibroids. She's had no vaginal bleeding in 2 years.  She stopped taking iron several months ago. NSAIDS maybe once a month.    Past Medical History:  Diagnosis Date  . Anemia   . Fibroid   . Gastritis   . SVD (spontaneous vaginal delivery)    x 1    Past Surgical History:  Procedure Laterality Date  . ABDOMINAL HYSTERECTOMY N/A 09/22/2014   Procedure: HYSTERECTOMY ABDOMINAL;  Surgeon: Frederico Hamman, MD;  Location: [facility];  Service: Gynecology;  Laterality: N/A;  . BILATERAL SALPINGECTOMY  09/22/2014   Procedure: BILATERAL SALPINGECTOMY;  Surgeon: Frederico Hamman, MD;  Location: [facility];  Service: Gynecology;;  . CERVICAL CONIZATION W/BX N/A 08/25/2014   Procedure: ATTEMPTED CONIZATION CERVIX ;  Surgeon: Frederico Hamman, MD;  Location: [facility];  Service: Gynecology;  Laterality: N/A;   Family History  Problem Relation Age of Onset  . Kidney disease Father    Social History  Substance Use Topics  . Smoking status: Never Smoker  . Smokeless tobacco: Never Used  . Alcohol use No   No current outpatient prescriptions on file.   No current facility-administered medications for this visit.    No Known Allergies   Review of Systems: All systems reviewed and negative except where noted in HPI.    Physical Exam: BP 120/80   Pulse 92   Ht 5\' 8"  (1.727 m)   Wt 169 lb 6.4 oz (76.8 kg)   SpO2 99%   BMI 25.76 kg/m  Constitutional:  Well-developed, black *female in no acute distress. Psychiatric: Normal mood and affect. Behavior is normal. EENT: Pupils equal. Conjunctivae are normal. No scleral icterus. Neck supple.  Cardiovascular: Normal rate, regular rhythm.  Pulmonary/chest: Effort normal and breath sounds normal. No wheezing, rales or rhonchi. Abdominal: Soft, nondistended, nontender. Bowel sounds active throughout. There are no masses palpable. No hepatomegaly. Extremities: no edema Lymphadenopathy: No cervical adenopathy noted. Neurological: Alert and oriented to person place and time. Skin: Skin is warm and dry. No rashes noted.   ASSESSMENT AND PLAN:  1. 41 yo female from [place]  with 4 month history of bowel changes and lower abdominal cramping. She has microcytic anemia, s/p hysterectomy for fibroids two years ago.  -for further evaluation Patient will be scheduled for a colonoscopy with possible polypectomy.  The risks and benefits of the procedure were discussed and the patient agrees to proceed.  -trial of bentyl prn cramping -iron studies, b12, folate  2.  Visual changes, difficulty focusing. Needs eye exam. Given names of some local Opthamologists.   3. Bad taste / odor in mouth per patient. No GERD symptoms but some improvement with Gaviscon. GERD related? Dental?    Tye Savoy, NP  08/29/2016, 2:30 PM

## 2016-09-03 NOTE — Progress Notes (Signed)
Agree with assessment and plan as outlined.  

## 2016-09-12 ENCOUNTER — Telehealth: Payer: Self-pay | Admitting: Gastroenterology

## 2016-09-12 ENCOUNTER — Encounter: Payer: Self-pay | Admitting: Gastroenterology

## 2016-09-12 NOTE — Telephone Encounter (Signed)
Pt is rescheduled for May. I left message informing pt that she can come get a sample since she has already paid one time for the prep. Sample place up front and pt instructed to call with any concerns.

## 2016-09-12 NOTE — Telephone Encounter (Signed)
Thanks for letting me know,  Caryl Pina can you please contact this patient and help her out with ordering another prep and rescheduling if she is not already rescheduled? thanks

## 2016-10-25 ENCOUNTER — Ambulatory Visit (AMBULATORY_SURGERY_CENTER): Payer: Medicaid Other | Admitting: Gastroenterology

## 2016-10-25 ENCOUNTER — Encounter: Payer: Self-pay | Admitting: Gastroenterology

## 2016-10-25 VITALS — BP 112/75 | HR 63 | Temp 98.0°F | Resp 16 | Ht 68.0 in | Wt 169.0 lb

## 2016-10-25 DIAGNOSIS — R194 Change in bowel habit: Secondary | ICD-10-CM

## 2016-10-25 DIAGNOSIS — R1030 Lower abdominal pain, unspecified: Secondary | ICD-10-CM | POA: Diagnosis not present

## 2016-10-25 MED ORDER — SODIUM CHLORIDE 0.9 % IV SOLN: 500.0000 mL | INTRAVENOUS | Status: AC

## 2016-10-25 NOTE — Op Note (Signed)
[facility] Patient Name: Janet Park Procedure Date: 10/25/2016 2:28 PM MRN: [id number] Endoscopist: Remo Lipps P. Armbruster MD, MD Age: 41 Referring MD:  Date of Birth: 1976/02/01 Gender: Female Account #: [id number] Procedure:                Colonoscopy Indications:              Lower abdominal pain, Change in bowel habits Medicines:                Monitored Anesthesia Care Procedure:                Pre-Anesthesia Assessment:                           - Prior to the procedure, a History and Physical                            was performed, and patient medications and                            allergies were reviewed. The patient's tolerance of                            previous anesthesia was also reviewed. The risks                            and benefits of the procedure and the sedation                            options and risks were discussed with the patient.                            All questions were answered, and informed consent                            was obtained. Prior Anticoagulants: The patient has                            taken no previous anticoagulant or antiplatelet                            agents. ASA Grade Assessment: II - A patient with                            mild systemic disease. After reviewing the risks                            and benefits, the patient was deemed in                            satisfactory condition to undergo the procedure.                           After obtaining informed consent, the colonoscope  was passed under direct vision. Throughout the                            procedure, the patient's blood pressure, pulse, and                            oxygen saturations were monitored continuously. The                            Model PCF-H190DL [id number]) scope was introduced                            through the anus and advanced to the the terminal                            ileum,  with identification of the appendiceal                            orifice and IC valve. The colonoscopy was performed                            without difficulty. The patient tolerated the                            procedure well. The quality of the bowel                            preparation was adequate. The terminal ileum,                            ileocecal valve, appendiceal orifice, and rectum                            were photographed. Scope In: 2:35:53 PM Scope Out: 2:55:15 PM Scope Withdrawal Time: 0 hours 13 minutes 58 seconds  Total Procedure Duration: 0 hours 19 minutes 22 seconds  Findings:                 The perianal and digital rectal examinations were                            normal.                           A large amount of liquid stool was found in the                            entire colon, making visualization difficult                            initially. Time was spent to lavage the entire                            colon using copious amounts of sterile water,  resulting in clearance with good visualization.                           The terminal ileum appeared normal.                           The exam was otherwise without abnormality on                            direct and retroflexion views. No pathology noted                            to cause the patient's symptoms. Complications:            No immediate complications. Estimated blood loss:                            None. Estimated Blood Loss:     Estimated blood loss: none. Impression:               - Stool in the entire examined colon, able to be                            cleared with adequate views obtained.                           - The examined portion of the ileum was normal.                           - The examination was otherwise normal on direct                            and retroflexion views. Recommendation:           - Patient has a contact number  available for                            emergencies. The signs and symptoms of potential                            delayed complications were discussed with the                            patient. Return to normal activities tomorrow.                            Written discharge instructions were provided to the                            patient.                           - Resume previous diet.                           - Continue present medications.                           -  Use bentyl as needed for cramps                           - Use miralax as needed for constipation                           - Repeat colonoscopy in 10 years for screening                            purposes. Remo Lipps P. Armbruster MD, MD 10/25/2016 3:04:21 PM This report has been signed electronically.

## 2016-10-25 NOTE — Progress Notes (Signed)
To PACU, vss patent aw report to rn 

## 2016-10-25 NOTE — Patient Instructions (Signed)
YOU HAD AN ENDOSCOPIC PROCEDURE TODAY AT [facility]:   Refer to the procedure report that was given to you for any specific questions about what was found during the examination.  If the procedure report does not answer your questions, please call your gastroenterologist to clarify.  If you requested that your care partner not be given the details of your procedure findings, then the procedure report has been included in a sealed envelope for you to review at your convenience later.  YOU SHOULD EXPECT: Some feelings of bloating in the abdomen. Passage of more gas than usual.  Walking can help get rid of the air that was put into your GI tract during the procedure and reduce the bloating. If you had a lower endoscopy (such as a colonoscopy or flexible sigmoidoscopy) you may notice spotting of blood in your stool or on the toilet paper. If you underwent a bowel prep for your procedure, you may not have a normal bowel movement for a few days.  Please Note:  You might notice some irritation and congestion in your nose or some drainage.  This is from the oxygen used during your procedure.  There is no need for concern and it should clear up in a day or so.  SYMPTOMS TO REPORT IMMEDIATELY:   Following lower endoscopy (colonoscopy or flexible sigmoidoscopy):  Excessive amounts of blood in the stool  Significant tenderness or worsening of abdominal pains  Swelling of the abdomen that is new, acute  Fever of 100F or higher   For urgent or emergent issues, a gastroenterologist can be reached at any hour by calling [phone].  Continue to take Bentyl for cramps and Miralax for as needed for constipation.   DIET:  We do recommend a small meal at first, but then you may proceed to your regular diet.  Drink plenty of fluids but you should avoid alcoholic beverages for 24 hours.  ACTIVITY:  You should plan to take it easy for the rest of today and you should NOT DRIVE or use heavy  machinery until tomorrow (because of the sedation medicines used during the test).    FOLLOW UP: Our staff will call the number listed on your records the next business day following your procedure to check on you and address any questions or concerns that you may have regarding the information given to you following your procedure. If we do not reach you, we will leave a message.  However, if you are feeling well and you are not experiencing any problems, there is no need to return our call.  We will assume that you have returned to your regular daily activities without incident.  If any biopsies were taken you will be contacted by phone or by letter within the next 1-3 weeks.  Please call us at [phone] if you have not heard about the biopsies in 3 weeks.    SIGNATURES/CONFIDENTIALITY: You and/or your care partner have signed paperwork which will be entered into your electronic medical record.  These signatures attest to the fact that that the information above on your After Visit Summary has been reviewed and is understood.  Full responsibility of the confidentiality of this discharge information lies with you and/or your care-partner.  Thank you for letting us take care of your healthcare needs today.

## 2016-10-25 NOTE — Progress Notes (Signed)
Pt's states no medical or surgical changes since previsit or office visit. 

## 2016-10-26 ENCOUNTER — Telehealth: Payer: Self-pay | Admitting: *Deleted

## 2016-10-26 NOTE — Telephone Encounter (Signed)
  Follow up Call-  Call back number 10/25/2016  Post procedure Call Back phone  # [phone]  Permission to leave phone message Yes  Some recent data might be hidden     Patient questions:  Do you have a fever, pain , or abdominal swelling? No. Pain Score  0 *  Have you tolerated food without any problems? Yes.    Have you been able to return to your normal activities? Yes.    Do you have any questions about your discharge instructions: Diet   No. Medications  No. Follow up visit  No.  Do you have questions or concerns about your Care? No.  Actions: * If pain score is 4 or above: No action needed, pain <4.

## 2017-03-15 ENCOUNTER — Other Ambulatory Visit: Payer: Self-pay | Admitting: Internal Medicine

## 2017-03-15 DIAGNOSIS — Z1231 Encounter for screening mammogram for malignant neoplasm of breast: Secondary | ICD-10-CM

## 2017-03-25 ENCOUNTER — Ambulatory Visit: Payer: Self-pay

## 2017-03-25 ENCOUNTER — Ambulatory Visit
Admission: RE | Admit: 2017-03-25 | Discharge: 2017-03-25 | Disposition: A | Discharge status: Home / Self Care | Payer: Medicaid Other | Source: Ambulatory Visit | Attending: Internal Medicine | Admitting: Internal Medicine

## 2017-03-25 DIAGNOSIS — Z1231 Encounter for screening mammogram for malignant neoplasm of breast: Secondary | ICD-10-CM

## 2018-09-23 IMAGING — MG 2D DIGITAL SCREENING BILATERAL MAMMOGRAM WITH CAD AND ADJUNCT TO
8 of 13 series · 8 of 29 positions shown · non-contrast
Comparison: Previous exam(s).

CLINICAL DATA: Screening.

EXAM:
2D DIGITAL SCREENING BILATERAL MAMMOGRAM WITH CAD AND ADJUNCT TOMO

[R CC (1 of 2)]
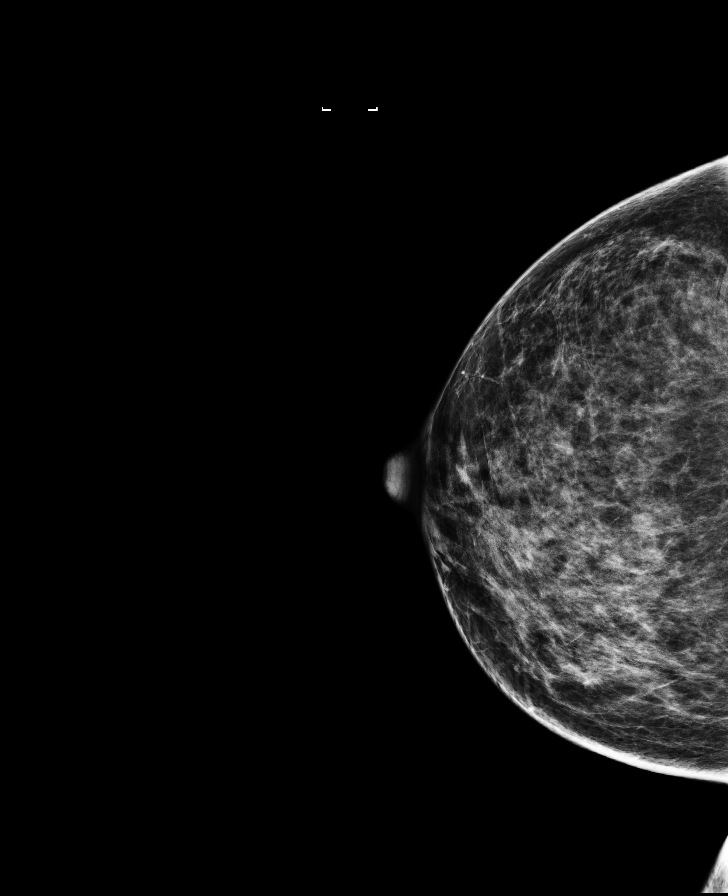

[L MLO]
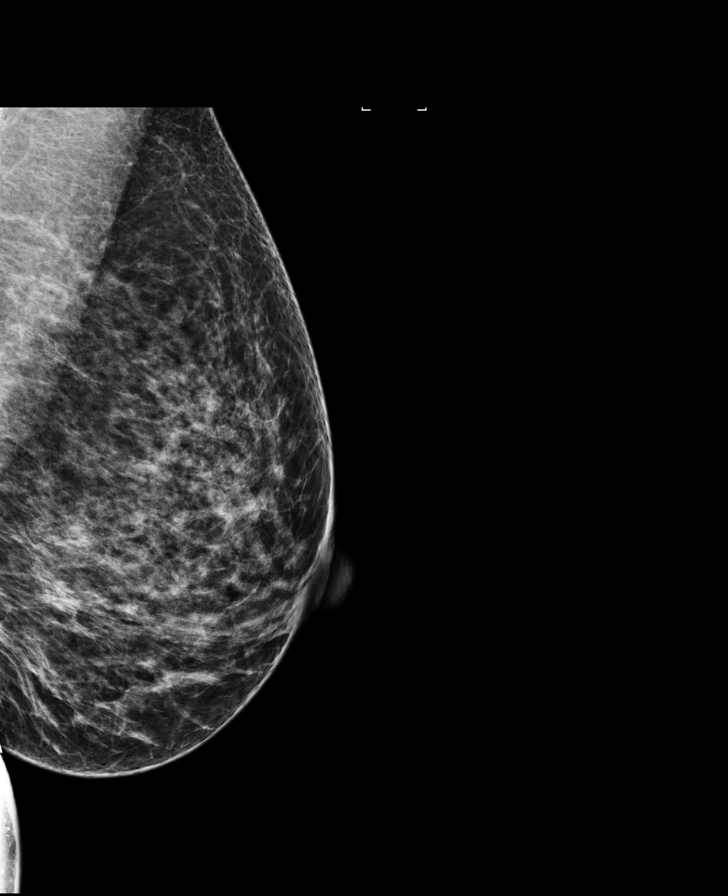

[R CC (2 of 2)]
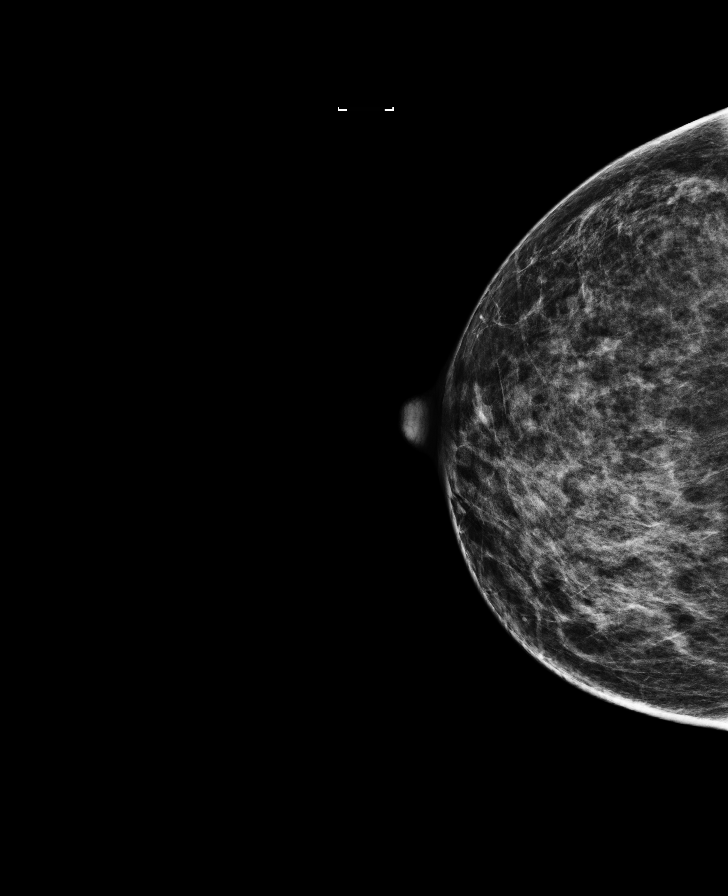

[R MLO]
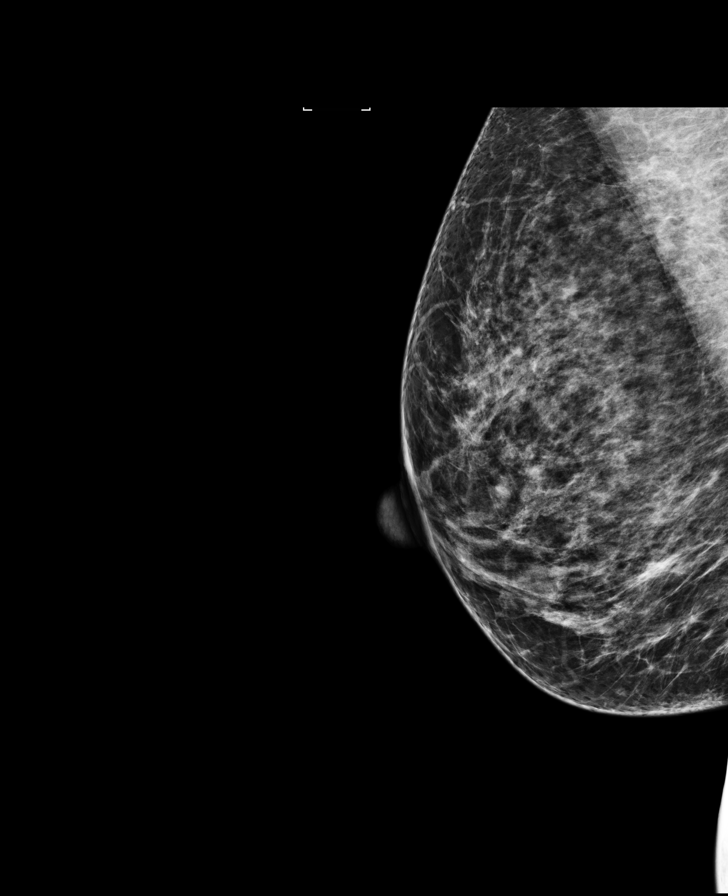

[L CC]
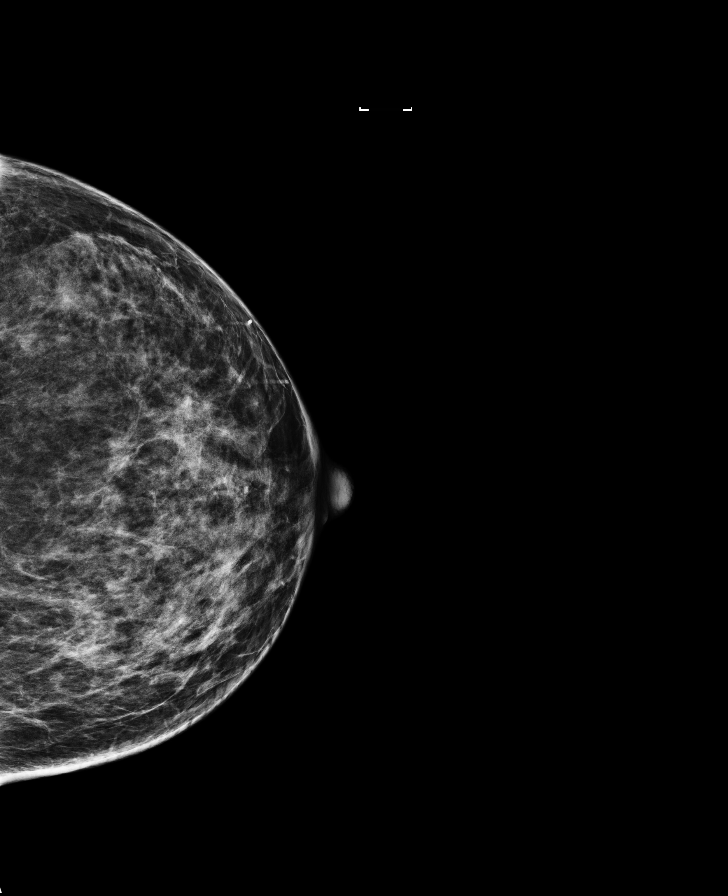

[R MLO synth-2D]
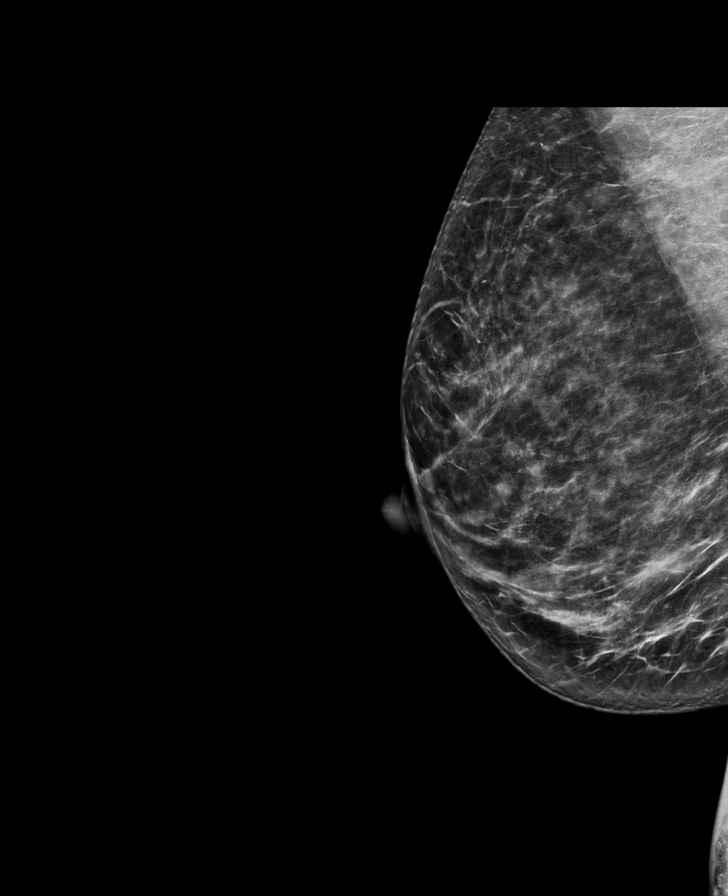

[R CC synth-2D]
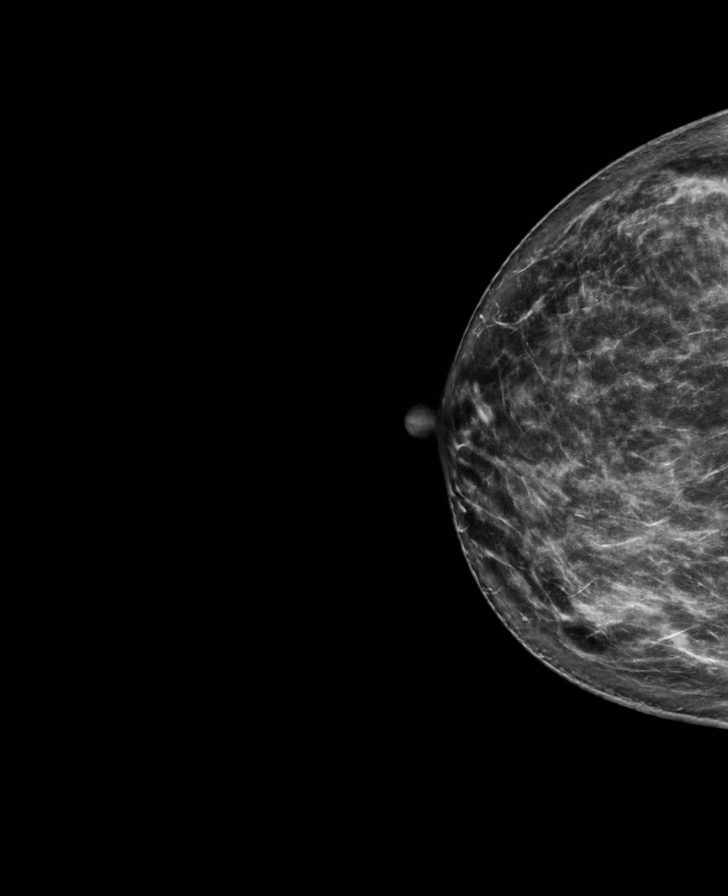

[L CC synth-2D]
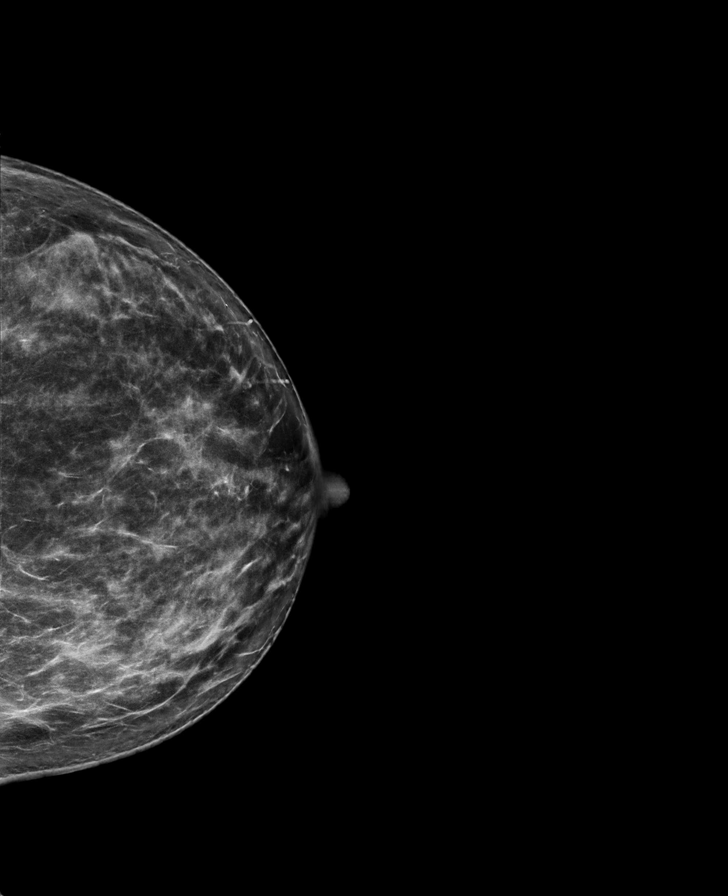

[8 of 29 positions shown; findings below may reference images not displayed]

ACR Breast Density Category c: The breast tissue is heterogeneously
dense, which may obscure small masses.
FINDINGS: There are no findings suspicious for malignancy. Images were
processed with CAD.
IMPRESSION: No mammographic evidence of malignancy. A result letter of this
screening mammogram will be mailed directly to the patient.

RECOMMENDATION:
Screening mammogram in one year. (Code:TN-[id number])

BI-RADS CATEGORY  1: Negative.

## 2023-04-03 DIAGNOSIS — G8929 Other chronic pain: Secondary | ICD-10-CM | POA: Diagnosis not present

## 2023-04-03 DIAGNOSIS — M545 Low back pain, unspecified: Secondary | ICD-10-CM | POA: Diagnosis not present

## 2024-01-29 DIAGNOSIS — M67432 Ganglion, left wrist: Secondary | ICD-10-CM | POA: Diagnosis not present

## 2024-01-29 DIAGNOSIS — M25532 Pain in left wrist: Secondary | ICD-10-CM | POA: Diagnosis not present
